# Patient Record
Sex: Male | Born: 1980
Health system: Southern US, Community
[De-identification: ages and names within clinical notes are randomized; demographics above are authoritative.]

## PROBLEM LIST (undated history)

## (undated) DIAGNOSIS — E119 Type 2 diabetes mellitus without complications: Secondary | ICD-10-CM

## (undated) DIAGNOSIS — F419 Anxiety disorder, unspecified: Secondary | ICD-10-CM

## (undated) DIAGNOSIS — J45909 Unspecified asthma, uncomplicated: Secondary | ICD-10-CM

## (undated) HISTORY — DX: Type 2 diabetes mellitus without complications: E11.9

## (undated) HISTORY — PX: NO PAST SURGERIES: SHX2092

## (undated) HISTORY — DX: Anxiety disorder, unspecified: F41.9

---

## 2002-12-27 ENCOUNTER — Emergency Department (HOSPITAL_COMMUNITY): Admission: EM | Admit: 2002-12-27 | Discharge: 2002-12-28 | Payer: Self-pay

## 2002-12-28 ENCOUNTER — Encounter: Payer: Self-pay | Admitting: *Deleted

## 2003-02-20 ENCOUNTER — Encounter: Payer: Self-pay | Admitting: Emergency Medicine

## 2003-02-20 ENCOUNTER — Emergency Department (HOSPITAL_COMMUNITY): Admission: EM | Admit: 2003-02-20 | Discharge: 2003-02-20 | Payer: Self-pay | Admitting: Emergency Medicine

## 2003-02-22 ENCOUNTER — Emergency Department (HOSPITAL_COMMUNITY): Admission: AD | Admit: 2003-02-22 | Discharge: 2003-02-22 | Payer: Self-pay | Admitting: Family Medicine

## 2004-02-25 ENCOUNTER — Emergency Department: Payer: Self-pay | Admitting: Unknown Physician Specialty

## 2004-08-16 ENCOUNTER — Emergency Department: Payer: Self-pay | Admitting: Emergency Medicine

## 2004-09-29 ENCOUNTER — Emergency Department: Payer: Self-pay | Admitting: Emergency Medicine

## 2004-10-16 ENCOUNTER — Emergency Department: Payer: Self-pay | Admitting: Emergency Medicine

## 2005-08-13 ENCOUNTER — Emergency Department: Payer: Self-pay | Admitting: Internal Medicine

## 2007-05-08 ENCOUNTER — Emergency Department: Payer: Self-pay | Admitting: Emergency Medicine

## 2007-05-08 ENCOUNTER — Other Ambulatory Visit: Payer: Self-pay

## 2007-06-29 ENCOUNTER — Emergency Department: Payer: Self-pay | Admitting: Emergency Medicine

## 2007-06-29 ENCOUNTER — Other Ambulatory Visit: Payer: Self-pay

## 2007-07-01 ENCOUNTER — Emergency Department (HOSPITAL_COMMUNITY): Admission: EM | Admit: 2007-07-01 | Discharge: 2007-07-02 | Payer: Self-pay | Admitting: Emergency Medicine

## 2007-07-03 ENCOUNTER — Emergency Department: Payer: Self-pay | Admitting: Emergency Medicine

## 2007-07-07 ENCOUNTER — Emergency Department: Payer: Self-pay | Admitting: Emergency Medicine

## 2007-08-04 ENCOUNTER — Other Ambulatory Visit: Payer: Self-pay

## 2007-08-04 ENCOUNTER — Emergency Department: Payer: Self-pay | Admitting: Emergency Medicine

## 2008-01-21 ENCOUNTER — Emergency Department: Payer: Self-pay | Admitting: Emergency Medicine

## 2008-02-10 ENCOUNTER — Emergency Department (HOSPITAL_COMMUNITY): Admission: EM | Admit: 2008-02-10 | Discharge: 2008-02-10 | Payer: Self-pay | Admitting: Emergency Medicine

## 2008-05-06 ENCOUNTER — Emergency Department (HOSPITAL_COMMUNITY): Admission: EM | Admit: 2008-05-06 | Discharge: 2008-05-06 | Payer: Self-pay | Admitting: Emergency Medicine

## 2009-12-27 IMAGING — CR DG CHEST 1V PORT
1 series · 1 of 1 positions shown · non-contrast
Comparison: none

REASON FOR EXAM: cp
COMMENTS:

PROCEDURE:     DXR - DXR PORTABLE CHEST SINGLE VIEW  - June 29, 2007  [DATE]
RESULT:     The lungs are adequately inflated and clear. The heart and
pulmonary vascularity are within the limits of normal. There is no pleural
effusion.

[view not recorded]
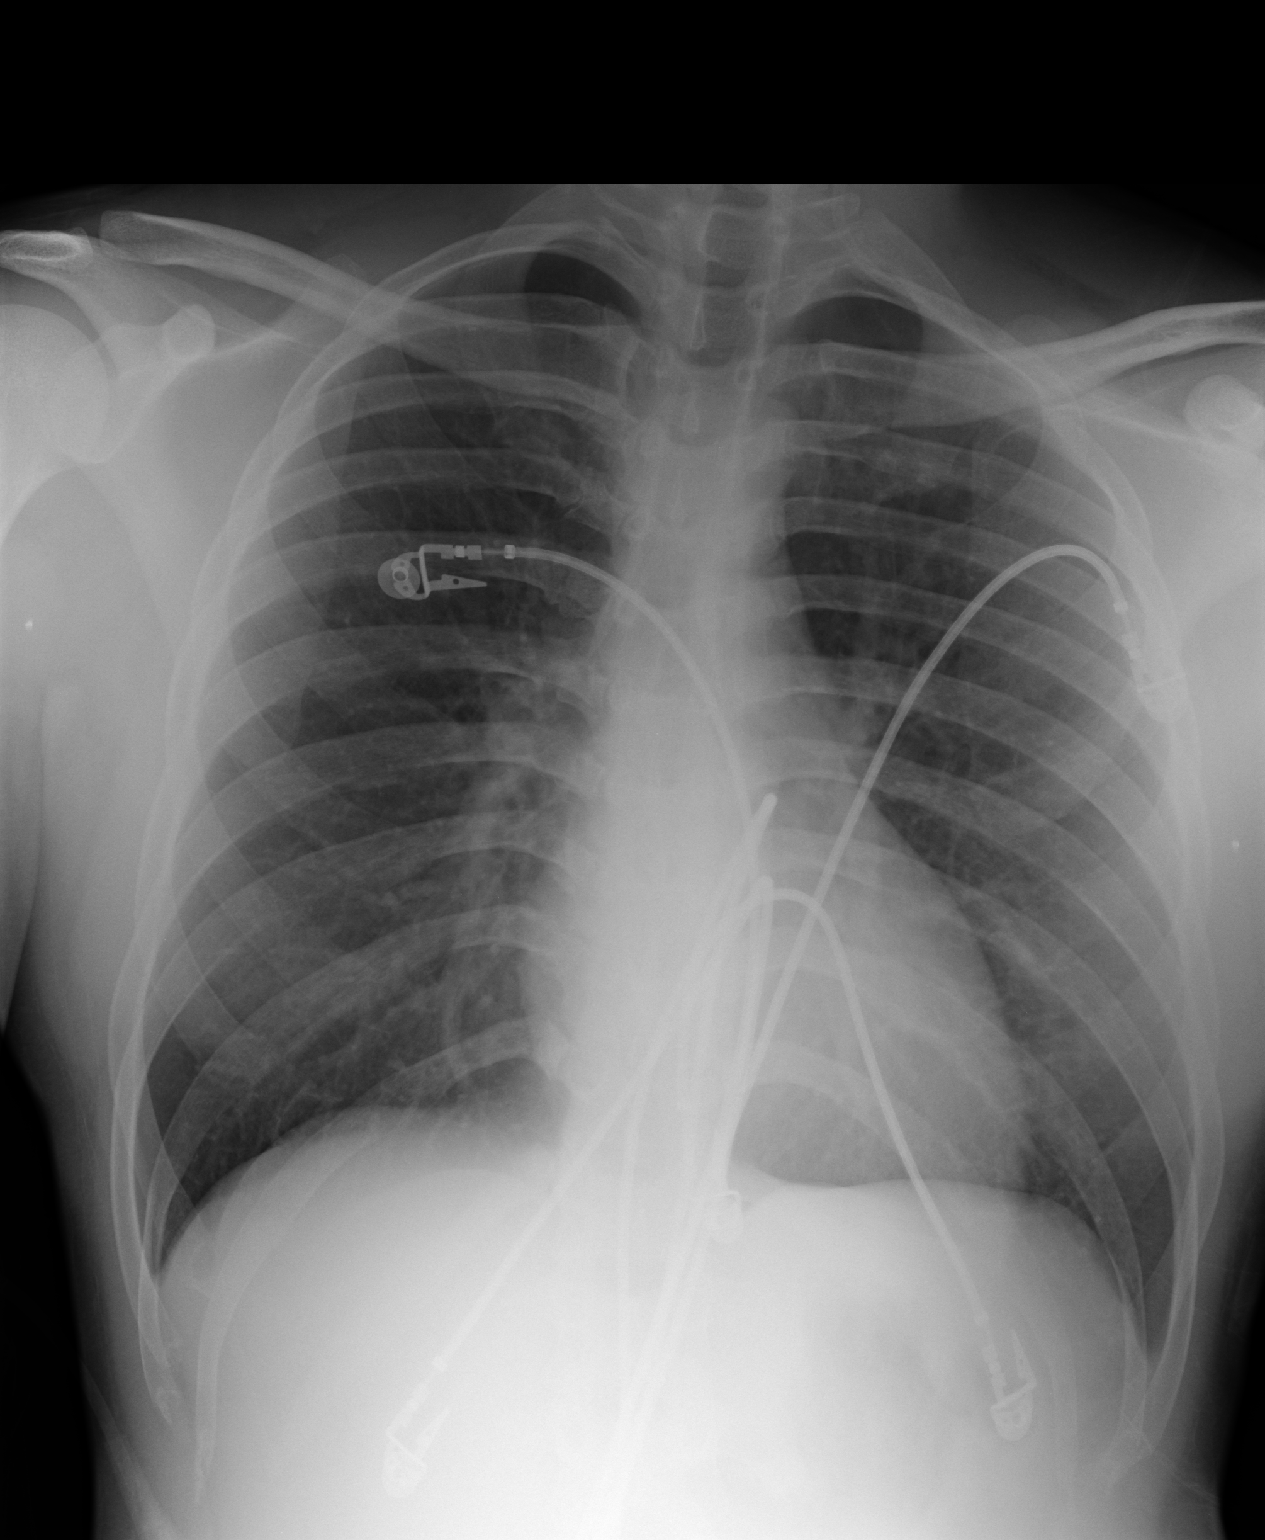

[1 of 1 positions shown; findings below may reference images not displayed]

IMPRESSION: I do not see evidence of acute cardiopulmonary abnormality.
A followup PA and lateral chest x-ray would be of value if the patient has
persistent symptoms.

## 2011-01-26 LAB — POCT I-STAT CREATININE
Creatinine, Ser: 1.2
Operator id: 277751

## 2011-01-26 LAB — I-STAT 8, (EC8 V) (CONVERTED LAB)
Acid-base deficit: 4 — ABNORMAL HIGH
BUN: 6
Bicarbonate: 20.8
Chloride: 110
Glucose, Bld: 82
HCT: 46
Hemoglobin: 15.6
Operator id: 277751
Potassium: 3.5
Sodium: 140
TCO2: 22
pCO2, Ven: 36 — ABNORMAL LOW
pH, Ven: 7.37 — ABNORMAL HIGH

## 2011-01-26 LAB — POCT CARDIAC MARKERS
CKMB, poc: <1 — ABNORMAL LOW
Myoglobin, poc: 91.4
Operator id: 277751

## 2011-01-26 LAB — URINALYSIS, ROUTINE W REFLEX MICROSCOPIC
Glucose, UA: NEGATIVE
Hgb urine dipstick: NEGATIVE
Ketones, ur: 15 — AB
Protein, ur: NEGATIVE
Urobilinogen, UA: 1

## 2011-01-26 LAB — D-DIMER, QUANTITATIVE: D-Dimer, Quant: 0.36

## 2011-05-25 ENCOUNTER — Emergency Department (HOSPITAL_COMMUNITY)
Admission: EM | Admit: 2011-05-25 | Discharge: 2011-05-25 | Disposition: A | Discharge status: Home / Self Care | Payer: Self-pay | Attending: Emergency Medicine | Admitting: Emergency Medicine

## 2011-05-25 DIAGNOSIS — M545 Low back pain, unspecified: Secondary | ICD-10-CM | POA: Insufficient documentation

## 2011-05-25 DIAGNOSIS — M549 Dorsalgia, unspecified: Secondary | ICD-10-CM

## 2011-05-25 DIAGNOSIS — R300 Dysuria: Secondary | ICD-10-CM | POA: Insufficient documentation

## 2011-05-25 DIAGNOSIS — R109 Unspecified abdominal pain: Secondary | ICD-10-CM | POA: Insufficient documentation

## 2011-05-25 LAB — URINALYSIS, ROUTINE W REFLEX MICROSCOPIC
Bilirubin Urine: NEGATIVE
Glucose, UA: NEGATIVE mg/dL
Hgb urine dipstick: NEGATIVE
Ketones, ur: NEGATIVE mg/dL
Nitrite: NEGATIVE
Protein, ur: NEGATIVE mg/dL
Specific Gravity, Urine: 1.019 (ref 1.005–1.030)
Urobilinogen, UA: 1 mg/dL (ref 0.0–1.0)
pH: 5.5 (ref 5.0–8.0)

## 2011-05-25 LAB — URINE MICROSCOPIC-ADD ON

## 2011-05-25 NOTE — ED Notes (Signed)
Pt reports intermittant bilateral flank pain x 2 weeks, lower abd pain near bladder and difficulty/pain with urination x 2 weeks. Denies blood in urine. Denies hx of kidney stones. Denies flank pain right now. Pt able to give urine specimen.

## 2011-05-25 NOTE — ED Provider Notes (Signed)
History    30yM with dysuria and lower back pressure. Gradual onset of symptoms about 2w ago. Only has symptoms when tries to urinate. Back pain feels like pressure and does not seem to lateralize. No fever or chills. Denies testicular pain, swelling, discharge or concern for std. No abdominal pain. No n/v. Smoker, otherwise health. Takes tylenol pm PRN sleep otherwise no meds.   CSN: [id number]  Arrival date & time 05/25/11  1117   First MD Initiated Contact with Patient 05/25/11 1136      Chief Complaint  Patient presents with  . Flank Pain    (Consider location/radiation/quality/duration/timing/severity/associated sxs/prior treatment) HPI  No past medical history on file.  No past surgical history on file.  No family history on file.  History  Substance Use Topics  . Smoking status: Not on file  . Smokeless tobacco: Not on file  . Alcohol Use: Not on file      Review of Systems   Review of symptoms negative unless otherwise noted in HPI.  Allergies  Review of patient's allergies indicates no known allergies.  Home Medications   Current Outpatient Rx  Name Route Sig Dispense Refill  . IBUPROFEN-DIPHENHYDRAMINE HCL 200-25 MG PO CAPS Oral Take 1 tablet by mouth daily as needed. For pain      BP 140/99  Pulse 114  Temp(Src) 98 F (36.7 C) (Oral)  Resp 18  Ht 5\' 7"  (1.702 m)  Wt 190 lb (86.183 kg)  BMI 29.76 kg/m2  SpO2 98%  Physical Exam  Nursing note and vitals reviewed. Constitutional: He appears well-developed and well-nourished. No distress.       Sitting up in bed. Pleasant and polite. NAD.  HENT:  Head: Normocephalic and atraumatic.  Right Ear: External ear normal.  Left Ear: External ear normal.  Eyes: Conjunctivae are normal. Pupils are equal, round, and reactive to light. Right eye exhibits no discharge. Left eye exhibits no discharge.  Neck: Neck supple.  Cardiovascular: Normal rate, regular rhythm and normal heart sounds.  Exam reveals no  gallop and no friction rub.   No murmur heard.      No tachycardia noted on exam as in triage vitals  Pulmonary/Chest: Effort normal and breath sounds normal. No respiratory distress.  Abdominal: Soft. He exhibits no distension. There is no tenderness. There is no rebound and no guarding.  Genitourinary:       No cva tenderness  Musculoskeletal: He exhibits no edema and no tenderness.  Neurological: He is alert.  Skin: Skin is warm and dry. He is not diaphoretic.  Psychiatric: He has a normal mood and affect. His behavior is normal. Thought content normal.    ED Course  Procedures (including critical care time)  Smoking Cessation Counseling (3-10 minutes): Pt counseled extensively concerning the health risks of smoking and the benefits of quitting. Reviewed strategies and techniques to help quit. Pharmacological cessation aids offered but pt is declining at this time.  Labs Reviewed  URINALYSIS, ROUTINE W REFLEX MICROSCOPIC - Abnormal; Notable for the following:    Leukocytes, UA SMALL (*)    All other components within normal limits  URINE MICROSCOPIC-ADD ON  LAB REPORT - SCANNED   No results found.   1. Back pain       MDM  30yM with flank pain. Consider uti/pyelonephrtis, ureteral stone, musculoskeletal, appy. Colits, STD. Etiology unclear but low clinical suspicion for urgent/emergent process. UA not suggestive of infection. Abdominal examination is benign. Plan symptomatic treatment and outpatient followup as  needed. Strict return precuations discussed.       Raeford Razor, MD 05/30/11 [phone]

## 2011-05-25 NOTE — ED Notes (Signed)
Took an NSAID to help with rt. Lower flank pain, and help some.

## 2011-05-25 NOTE — ED Notes (Signed)
Difficulty voiding for x 2 weeks. Tried to stay hydrated. Lower flank pain - intermittent. No std symptoms. No fever. Urine clear.

## 2012-03-10 ENCOUNTER — Emergency Department (HOSPITAL_COMMUNITY): Payer: Self-pay

## 2012-03-10 ENCOUNTER — Emergency Department (HOSPITAL_COMMUNITY)
Admission: EM | Admit: 2012-03-10 | Discharge: 2012-03-10 | Disposition: A | Discharge status: Home / Self Care | Payer: Self-pay | Attending: Emergency Medicine | Admitting: Emergency Medicine

## 2012-03-10 ENCOUNTER — Encounter (HOSPITAL_COMMUNITY): Payer: Self-pay | Admitting: Emergency Medicine

## 2012-03-10 DIAGNOSIS — M549 Dorsalgia, unspecified: Secondary | ICD-10-CM

## 2012-03-10 DIAGNOSIS — F17200 Nicotine dependence, unspecified, uncomplicated: Secondary | ICD-10-CM | POA: Insufficient documentation

## 2012-03-10 DIAGNOSIS — M545 Low back pain, unspecified: Secondary | ICD-10-CM | POA: Insufficient documentation

## 2012-03-10 DIAGNOSIS — Z79899 Other long term (current) drug therapy: Secondary | ICD-10-CM | POA: Insufficient documentation

## 2012-03-10 DIAGNOSIS — J45909 Unspecified asthma, uncomplicated: Secondary | ICD-10-CM | POA: Insufficient documentation

## 2012-03-10 HISTORY — DX: Unspecified asthma, uncomplicated: J45.909

## 2012-03-10 MED ORDER — CYCLOBENZAPRINE HCL 10 MG PO TABS: 10.0000 mg | ORAL_TABLET | Freq: Three times a day (TID) | ORAL | Status: DC | PRN

## 2012-03-10 MED ORDER — IBUPROFEN 800 MG PO TABS: 800.0000 mg | ORAL_TABLET | Freq: Three times a day (TID) | ORAL | Status: DC | PRN

## 2012-03-10 NOTE — ED Notes (Signed)
Pt c/o lower back pain x 2 weeks. Pt states back feels stiff and "jerking" when sitting and with movement. Denies gi/gu.

## 2012-03-10 NOTE — ED Provider Notes (Signed)
History   This chart was scribed for Benny Lennert, MD by Charolett Bumpers . The patient was seen in room APA17/APA17. Patient's care was started at 0816.   CSN: [id number]  Arrival date & time 03/10/12  4098   First MD Initiated Contact with Patient 03/10/12 [phone]      Chief Complaint  Patient presents with  . Back Pain    HPI Comments: Matthew Mcfarland is a 31 y.o. male who presents to the Emergency Department complaining of moderate, gradually worsening lower back pain for the past 2 weeks. He state he was loading heavy objects prior to the onset of back pain but denies any known injuries. He reports he has tried hot/cold compresses and ibuprofen without relief. He states when sits down he feels pressure and his back is worse. He denies any urinary symptoms.   Patient is a 31 y.o. male presenting with back pain. The history is provided by the patient. No language interpreter was used.  Back Pain  This is a new problem. The current episode started more than 1 week ago. The problem has been gradually worsening. The pain is associated with lifting heavy objects. The pain is present in the lumbar spine. Pertinent negatives include no chest pain, no headaches and no abdominal pain.    Past Medical History  Diagnosis Date  . Asthma     History reviewed. No pertinent past surgical history.  History reviewed. No pertinent family history.  History  Substance Use Topics  . Smoking status: Current Every Day Smoker -- 0.5 packs/day    Types: Cigarettes  . Smokeless tobacco: Not on file  . Alcohol Use: No      Review of Systems  Constitutional: Negative for fatigue.  HENT: Negative for congestion, sinus pressure and ear discharge.   Eyes: Negative for discharge.  Respiratory: Negative for cough.   Cardiovascular: Negative for chest pain.  Gastrointestinal: Negative for abdominal pain and diarrhea.  Genitourinary: Negative for frequency and hematuria.  Musculoskeletal:  Positive for back pain.  Skin: Negative for rash.  Neurological: Negative for seizures and headaches.  Hematological: Negative.   Psychiatric/Behavioral: Negative for hallucinations.    Allergies  Review of patient's allergies indicates no known allergies.  Home Medications   Current Outpatient Rx  Name  Route  Sig  Dispense  Refill  . IBUPROFEN-DIPHENHYDRAMINE HCL 200-25 MG PO CAPS   Oral   Take 1 tablet by mouth daily as needed. For pain           BP 134/84  Pulse 92  Temp 98.2 F (36.8 C)  Resp 18  Ht 5\' 6"  (1.676 m)  Wt 190 lb (86.183 kg)  BMI 30.67 kg/m2  SpO2 100%  Physical Exam  Constitutional: He is oriented to person, place, and time. He appears well-developed.  HENT:  Head: Normocephalic and atraumatic.  Eyes: Conjunctivae normal and EOM are normal. No scleral icterus.  Neck: Normal range of motion. Neck supple. No thyromegaly present.  Cardiovascular: Normal rate, regular rhythm and normal heart sounds.  Exam reveals no gallop and no friction rub.   No murmur heard. Pulmonary/Chest: Effort normal and breath sounds normal. No stridor. No respiratory distress. He has no wheezes. He has no rales. He exhibits no tenderness.  Abdominal: Soft. He exhibits no distension. There is no tenderness. There is no rebound.  Musculoskeletal: Normal range of motion. He exhibits tenderness. He exhibits no edema.       Tenderness to midline lumbar spine. Cervical  and thoracic spine are non-tender.   Lymphadenopathy:    He has no cervical adenopathy.  Neurological: He is alert and oriented to person, place, and time. Coordination normal.  Skin: Skin is warm. No rash noted. No erythema.  Psychiatric: He has a normal mood and affect. His behavior is normal.    ED Course  Procedures (including critical care time)  DIAGNOSTIC STUDIES: Oxygen Saturation is 100% on room air, normal by my interpretation.    COORDINATION OF CARE:  08:21-Discussed planned course of treatment  with the patient including an x-ray of lumbar spine, who is agreeable at this time.   10:08-Recheck: Informed pt of negative x-ray results. Will d/c home with a muscle relaxer and pain medication and pt is to f/u with PCP. He is agreeable with plan.   Dg Lumbar Spine Complete  03/10/2012  *RADIOLOGY REPORT*  Clinical Data: Back pain  LUMBAR SPINE - COMPLETE 4+ VIEW  Comparison: None  Findings: The imaged vertebral bodies and inter-vertebral disc spaces are maintained. No displaced acute fracture or dislocation identified.   The para-vertebral and overlying soft tissues are within normal limits.  IMPRESSION: No acute osseous abnormality of the lumbar spine.   Original Report Authenticated By: Jearld Lesch, M.D.      No diagnosis found.    MDM      The chart was scribed for me under my direct supervision.  I personally performed the history, physical, and medical decision making and all procedures in the evaluation of this patient.Benny Lennert, MD 03/10/12 [phone]

## 2013-09-02 ENCOUNTER — Emergency Department (HOSPITAL_COMMUNITY)
Admission: EM | Admit: 2013-09-02 | Discharge: 2013-09-02 | Disposition: A | Discharge status: Home / Self Care | Payer: Self-pay | Attending: Emergency Medicine | Admitting: Emergency Medicine

## 2013-09-02 ENCOUNTER — Encounter (HOSPITAL_COMMUNITY): Payer: Self-pay | Admitting: Emergency Medicine

## 2013-09-02 DIAGNOSIS — S0100XA Unspecified open wound of scalp, initial encounter: Secondary | ICD-10-CM | POA: Insufficient documentation

## 2013-09-02 DIAGNOSIS — J45909 Unspecified asthma, uncomplicated: Secondary | ICD-10-CM | POA: Insufficient documentation

## 2013-09-02 DIAGNOSIS — Y929 Unspecified place or not applicable: Secondary | ICD-10-CM | POA: Insufficient documentation

## 2013-09-02 DIAGNOSIS — R110 Nausea: Secondary | ICD-10-CM | POA: Insufficient documentation

## 2013-09-02 DIAGNOSIS — F17200 Nicotine dependence, unspecified, uncomplicated: Secondary | ICD-10-CM | POA: Insufficient documentation

## 2013-09-02 DIAGNOSIS — S0101XA Laceration without foreign body of scalp, initial encounter: Secondary | ICD-10-CM

## 2013-09-02 DIAGNOSIS — Z79899 Other long term (current) drug therapy: Secondary | ICD-10-CM | POA: Insufficient documentation

## 2013-09-02 DIAGNOSIS — S0990XA Unspecified injury of head, initial encounter: Secondary | ICD-10-CM | POA: Insufficient documentation

## 2013-09-02 DIAGNOSIS — R42 Dizziness and giddiness: Secondary | ICD-10-CM | POA: Insufficient documentation

## 2013-09-02 DIAGNOSIS — Y9389 Activity, other specified: Secondary | ICD-10-CM | POA: Insufficient documentation

## 2013-09-02 DIAGNOSIS — Y990 Civilian activity done for income or pay: Secondary | ICD-10-CM | POA: Insufficient documentation

## 2013-09-02 DIAGNOSIS — W208XXA Other cause of strike by thrown, projected or falling object, initial encounter: Secondary | ICD-10-CM | POA: Insufficient documentation

## 2013-09-02 DIAGNOSIS — Z23 Encounter for immunization: Secondary | ICD-10-CM | POA: Insufficient documentation

## 2013-09-02 DIAGNOSIS — H9190 Unspecified hearing loss, unspecified ear: Secondary | ICD-10-CM | POA: Insufficient documentation

## 2013-09-02 MED ORDER — TETANUS-DIPHTH-ACELL PERTUSSIS 5-2.5-18.5 LF-MCG/0.5 IM SUSP
0.5000 mL | Freq: Once | INTRAMUSCULAR | Status: AC
  Administered 2013-09-02: 0.5 mL via INTRAMUSCULAR
  Filled 2013-09-02: qty 0.5

## 2013-09-02 MED ORDER — ACETAMINOPHEN 500 MG PO TABS
1000.0000 mg | ORAL_TABLET | Freq: Once | ORAL | Status: AC
  Administered 2013-09-02: 1000 mg via ORAL
  Filled 2013-09-02: qty 2

## 2013-09-02 NOTE — ED Notes (Signed)
Pt states he has slight nausea off and on. Feels "a little woozy at times". Pt alert, no acute distress.

## 2013-09-02 NOTE — ED Provider Notes (Signed)
CSN: [id number]     Arrival date & time 09/02/13  1912 History  This chart was scribed for non-physician practitioner, Otilio Miuatherine E. Graeme Menees, PA-C, working with Celene KrasJon R Knapp, MD, by Ellin MayhewMichael Levi, ED Scribe. This patient was seen in room WTR6/WTR6 and the patient's care was started at 8:42 PM.  The history is provided by the patient. No language interpreter was used.   HPI Comments: Matthew Mcfarland is a 33 y.o. male who presents to the Emergency Department with a chief complaint of a head injury to the L occipital region with onset approximately 2 hours ago today. Patient reports that he opened the refrigerator earlier at work and a Passenger transport managergarbage disposal motor (approximately 30-50lbs) fell on top of his head from a distance of 2-3 feet. He characterizes the pain as a pressure sensation that is made worse with contact pressure. He reports some associated dizziness, nausea, and hearing disturbance of the L ear, which he describes as a "muffline". Patient denies any LOC, vision changes, or neck pain. Patient reports he works with a [employer] company.  Past Medical History  Diagnosis Date  . Asthma    History reviewed. No pertinent past surgical history. Family History  Problem Relation Age of Onset  . Diabetes Father   . Seizures Father    History  Substance Use Topics  . Smoking status: Current Every Day Smoker -- 0.50 packs/day    Types: Cigarettes  . Smokeless tobacco: Not on file  . Alcohol Use: No    Review of Systems  Constitutional: Negative for fever and chills.  HENT:       Hearing disturbance  Eyes: Negative for visual disturbance.  Gastrointestinal: Positive for nausea. Negative for vomiting.  Musculoskeletal: Negative for arthralgias, neck pain and neck stiffness.  Skin: Positive for wound.  Neurological: Positive for dizziness and headaches. Negative for syncope, weakness, light-headedness and numbness.  All other systems reviewed and are negative.  Allergies  Review of  patient's allergies indicates no known allergies.  Home Medications   Prior to Admission medications   Medication Sig Start Date End Date Taking? Authorizing Provider  cyclobenzaprine (FLEXERIL) 10 MG tablet Take 1 tablet (10 mg total) by mouth 3 (three) times daily as needed for muscle spasms. 03/10/12   Benny LennertJoseph L Zammit, MD  ibuprofen (ADVIL,MOTRIN) 200 MG tablet Take 800 mg by mouth every 6 (six) hours as needed. Back pain.    Historical Provider, MD  ibuprofen (ADVIL,MOTRIN) 800 MG tablet Take 1 tablet (800 mg total) by mouth every 8 (eight) hours as needed for pain (pain). 03/10/12   Benny LennertJoseph L Zammit, MD  Ibuprofen-Diphenhydramine HCl (ADVIL PM) 200-25 MG CAPS Take 1 tablet by mouth daily as needed. For pain    Historical Provider, MD  oxymetazoline (AFRIN) 0.05 % nasal spray Place 2 sprays into the nose 2 (two) times daily.    Historical Provider, MD   Triage Vitals: BP 129/94  Pulse 101  Temp(Src) 98 F (36.7 C) (Oral)  Resp 16  SpO2 99%  Physical Exam  Nursing note and vitals reviewed. Constitutional: He is oriented to person, place, and time. He appears well-developed and well-nourished. No distress.  HENT:  Head: Normocephalic and atraumatic.  Eyes:  Normal appearance  Neck: Normal range of motion.  Cardiovascular: Normal rate, regular rhythm and normal heart sounds.   Pulmonary/Chest: Effort normal and breath sounds normal. He has no wheezes.  Musculoskeletal: Normal range of motion.  Patient is ambulatory with no pain or abnormalities.  Neurological: He is alert and oriented to person, place, and time. No sensory deficit.  CN 3-12 intact.  No sensory deficits.  5/5 and equal upper and lower extremity strength.  No past pointing.  nml gait.   Psychiatric: He has a normal mood and affect. His behavior is normal.    ED Course  Procedures (including critical care time) LACERATION REPAIR Performed by: Otilio Miuatherine E Kiosha Buchan Authorized by: Otilio Miuatherine E Miyuki Rzasa Consent:  Verbal consent obtained. Risks and benefits: risks, benefits and alternatives were discussed Consent given by: patient Patient identity confirmed: provided demographic data Prepped and Draped in normal sterile fashion Wound explored  Laceration Location: R parietal scalp  Laceration Length: 2.0cm  No Foreign Bodies seen or palpated  Anesthesia: none  Irrigation method: lavage (nursing staff) Amount of cleaning: standard  Skin closure: 2 staples    Patient tolerance: Patient tolerated the procedure well with no immediate complications.  COORDINATION OF CARE: 8:47 PM-Discussed my low suspicion of serious brain injury. Will apply a few staples to close head wound. Will update tetanus shot in ED. Advised patient to return to ED if symptoms worsen or it any severe dizziness or behavioral abnormalities develops. Treatment plan discussed with patient and patient agrees.  MDM   Final diagnoses:  Minor head injury  Scalp laceration    Healthy 33yo M presents w/ head injury and scalp lac.  Wound cleaned by nursing staff and then stapled.  Tetanus updated.  Doubt TBI; pt is not anti-coagulated, no LOC, no neurologic complaints other than mild headache and no focal neuro deficits on exam.  Tylenol ordered for pain.   Return precautions discussed.    I personally performed the services described in this documentation, which was scribed in my presence. The recorded information has been reviewed and is accurate.    Otilio Miuatherine E Schylar Wuebker, PA-C 09/02/13 2209

## 2013-09-02 NOTE — ED Notes (Signed)
Pt states someone put a garbage disposal motor on top of the refrigerator and when he opened it it fell off and struck him on the head  Pt has a small lac on the top of his head  Pt denies LOC but states his head hurts  Pt states when you touch the laceration it feels like pressure  Pt states this happened at work

## 2013-09-02 NOTE — Discharge Instructions (Signed)
Treat pain w/ motrin or tylenol.  You can alternate these two medications every three hours if necessary.  Apply an ice pack to your scalp to reduce swelling of scalp.  [facility] your primary care doctor [facility] [phone]; [address]) in 7-10 days for wound recheck and staple removal.  You should be seen sooner if you develop fever, worsening pain or redness/drainage of pus at site of wound.  You should also return if you develop worsening headache, vision changes, severe dizziness, numbness of an extremity, change in speech, difficulty walking or with balance or more than 2 episodes of vomiting.

## 2013-09-04 NOTE — ED Provider Notes (Signed)
Medical screening examination/treatment/procedure(s) were performed by non-physician practitioner and as supervising physician I was immediately available for consultation/collaboration.    Navreet Bolda R Darah Simkin, MD 09/04/13 1536 

## 2013-09-11 ENCOUNTER — Encounter (HOSPITAL_COMMUNITY): Payer: Self-pay | Admitting: Emergency Medicine

## 2013-09-11 ENCOUNTER — Emergency Department (INDEPENDENT_AMBULATORY_CARE_PROVIDER_SITE_OTHER)
Admission: EM | Admit: 2013-09-11 | Discharge: 2013-09-11 | Disposition: A | Discharge status: Home / Self Care | Payer: Self-pay | Source: Home / Self Care | Attending: Emergency Medicine | Admitting: Emergency Medicine

## 2013-09-11 DIAGNOSIS — Z4802 Encounter for removal of sutures: Secondary | ICD-10-CM

## 2013-09-11 NOTE — ED Notes (Signed)
Pt is here to have staples removed; placed in [facility] no new concerns Alert w/no signs of acute distress.

## 2013-09-11 NOTE — ED Provider Notes (Signed)
CSN: [id number]     Arrival date & time 09/11/13  1612 History   First MD Initiated Contact with Patient 09/11/13 1717     Chief Complaint  Patient presents with  . Suture / Staple Removal   (Consider location/radiation/quality/duration/timing/severity/associated sxs/prior Treatment) HPI Comments: Patient presents for staple removal. Wound healing appropriately. No current complaints. No increasing pain or drainage.  Patient is a 33 y.o. male presenting with suture removal.  Suture / Staple Removal    Past Medical History  Diagnosis Date  . Asthma    History reviewed. No pertinent past surgical history. Family History  Problem Relation Age of Onset  . Diabetes Father   . Seizures Father    History  Substance Use Topics  . Smoking status: Current Every Day Smoker -- 0.50 packs/day    Types: Cigarettes  . Smokeless tobacco: Not on file  . Alcohol Use: No    Review of Systems  Skin: Positive for wound.  All other systems reviewed and are negative.   Allergies  Review of patient's allergies indicates no known allergies.  Home Medications   Prior to Admission medications   Medication Sig Start Date End Date Taking? Authorizing Provider  cyclobenzaprine (FLEXERIL) 10 MG tablet Take 1 tablet (10 mg total) by mouth 3 (three) times daily as needed for muscle spasms. 03/10/12   Benny LennertJoseph L Zammit, MD  ibuprofen (ADVIL,MOTRIN) 200 MG tablet Take 800 mg by mouth every 6 (six) hours as needed. Back pain.    Historical Provider, MD  ibuprofen (ADVIL,MOTRIN) 800 MG tablet Take 1 tablet (800 mg total) by mouth every 8 (eight) hours as needed for pain (pain). 03/10/12   Benny LennertJoseph L Zammit, MD  Ibuprofen-Diphenhydramine HCl (ADVIL PM) 200-25 MG CAPS Take 1 tablet by mouth daily as needed. For pain    Historical Provider, MD  oxymetazoline (AFRIN) 0.05 % nasal spray Place 2 sprays into the nose 2 (two) times daily.    Historical Provider, MD   There were no vitals taken for this  visit. Physical Exam  Nursing note and vitals reviewed. Constitutional: He is oriented to person, place, and time. He appears well-developed and well-nourished. No distress.  HENT:  Head: Normocephalic. Head is with laceration.    Pulmonary/Chest: Effort normal. No respiratory distress.  Neurological: He is alert and oriented to person, place, and time. Coordination normal.  Skin: Skin is warm and dry. No rash noted. He is not diaphoretic.  Psychiatric: He has a normal mood and affect. Judgment normal.    ED Course  Procedures (including critical care time) Labs Review Labs Reviewed - No data to display  Imaging Review No results found.   MDM   1. Encounter for staple removal    Wound appears healed appropriately. Staples removed. Followup as needed.    Graylon GoodZachary H Novia Lansberry, PA-C 09/11/13 1723

## 2013-09-11 NOTE — Discharge Instructions (Signed)
  Staple Removal Care After The staples used to close your skin have been removed. The wound needs continued care so it can heal completely and without problems. The care described here will need to be done for another 5-10 days unless your caregiver advises otherwise.  HOME CARE INSTRUCTIONS   Keep wound site dry and clean.  If skin adhesive strips were applied after the staples were removed, they will begin to peel off in a few days. If they remain after fourteen days, they may be peeled off and discarded.  If you still have a dressing, change it at least once a day or as instructed by your caregiver. If the bandage sticks, soak it off with warm water. Pat dry with a clean towel. Look for signs of infection (see below).  Reapply cream or ointment according to your caregiver's instruction. This will help prevent infection and keep the bandage from sticking. Use of a non-stick material over the wound and under the dressing or wrap will also help keep the bandage from sticking.  If the bandage becomes wet, dirty or develops a foul smell, change it as soon as possible.  New scars become sunburned easily. Use sunscreens with protection factor (SPF) of at least 15 when out in the sun.  Only take over-the-counter or prescription medicines for pain, discomfort or fever as directed by your caregiver. SEEK IMMEDIATE MEDICAL CARE IF:   There is redness, swelling or increasing pain in the wound.  Pus is coming from the wound.  An unexplained oral temperature above 102 F (38.9 C) develops.  You notice a foul smell coming from the wound or dressing.  There is a breaking open of the suture line (edges not staying together) of the wound edges after staples have been removed. Document Released: 04/05/2008 Document Revised: 07/16/2011 Document Reviewed: 04/05/2008 [employer] Patient Information 2014 [employer].  

## 2013-09-12 NOTE — ED Provider Notes (Signed)
Medical screening examination/treatment/procedure(s) were performed by non-physician practitioner and as supervising physician I was immediately available for consultation/collaboration.  Aasiyah Auerbach, M.D.  Herta Hink C Zeynab Klett, MD 09/12/13 1856 

## 2014-02-15 ENCOUNTER — Encounter (HOSPITAL_COMMUNITY): Payer: Self-pay | Admitting: Emergency Medicine

## 2014-02-15 ENCOUNTER — Emergency Department (HOSPITAL_COMMUNITY)
Admission: EM | Admit: 2014-02-15 | Discharge: 2014-02-15 | Disposition: A | Discharge status: Home / Self Care | Payer: Self-pay | Attending: Emergency Medicine | Admitting: Emergency Medicine

## 2014-02-15 DIAGNOSIS — M5431 Sciatica, right side: Secondary | ICD-10-CM

## 2014-02-15 DIAGNOSIS — Z720 Tobacco use: Secondary | ICD-10-CM | POA: Insufficient documentation

## 2014-02-15 DIAGNOSIS — G5701 Lesion of sciatic nerve, right lower limb: Secondary | ICD-10-CM | POA: Insufficient documentation

## 2014-02-15 DIAGNOSIS — J45909 Unspecified asthma, uncomplicated: Secondary | ICD-10-CM | POA: Insufficient documentation

## 2014-02-15 DIAGNOSIS — Z79899 Other long term (current) drug therapy: Secondary | ICD-10-CM | POA: Insufficient documentation

## 2014-02-15 MED ORDER — HYDROCODONE-ACETAMINOPHEN 5-325 MG PO TABS: 1.0000 | ORAL_TABLET | Freq: Four times a day (QID) | ORAL | Status: DC | PRN

## 2014-02-15 MED ORDER — NAPROXEN 500 MG PO TABS: 500.0000 mg | ORAL_TABLET | Freq: Two times a day (BID) | ORAL | Status: DC | PRN

## 2014-02-15 MED ORDER — CYCLOBENZAPRINE HCL 10 MG PO TABS: 10.0000 mg | ORAL_TABLET | Freq: Three times a day (TID) | ORAL | Status: DC | PRN

## 2014-02-15 MED ORDER — PREDNISONE 20 MG PO TABS: ORAL_TABLET | ORAL | Status: DC

## 2014-02-15 NOTE — Discharge Instructions (Signed)
Your pain seems to be related to piriformis syndrome, which is causing some nerve pain down the back of your leg from pinching of the sciatic nerve. Use massage, heat, and naprosyn to help with this pain. Use prednisone as directed to help with the nerve pain. Use norco as needed, as directed for breakthrough pain. Use flexeril as needed as directed for spasms. Use the resource guide below to find a regular doctor. Avoid strenuous activities for 2 weeks to help resolve your pain. Return to the ER for changes or worsening of pain.   Piriformis Syndrome with Rehab Piriformis syndrome is a condition the affects the nervous system in the area of the hip, and is characterized by pain and possibly a loss of feeling in the backside (posterior) thigh that may extend down the entire length of the leg. The symptoms are caused by an increase in pressure on the sciatic nerve by the piriformis muscle, which is on the back of the hip and is responsible for externally rotating the hip. The sciatic nerve and its branches connect to much of the leg. Normally the sciatic nerve runs between the piriformis muscle and other muscles. However, in certain individuals the nerve runs through the muscle, which causes an increase in pressure on the nerve and results in the symptoms of piriformis syndrome. SYMPTOMS   Pain, tingling, numbness, or burning in the back of the thigh that may also extend down the entire leg.  Occasionally, tenderness in the buttock.  Loss of function of the leg.  Pain that worsens when using the piriformis muscle (running, jumping, or stairs).  Pain that increases with prolonged sitting.  Pain that is lessened by lying flat on the back. CAUSES   Piriformis syndrome is the result of an increase in pressure placed on the sciatic nerve. Oftentimes, piriformis syndrome is an overuse injury.  Stress placed on the nerve from a sudden increase in the intensity, frequency, or duration of  training.  Compensation of other extremity injuries. RISK INCREASES WITH:  Sports that involve the piriformis muscle (running, walking, or jumping).  You are born with (congenital) a defect in which the sciatic nerve passes through the muscle. PREVENTION  Warm up and stretch properly before activity.  Allow for adequate recovery between workouts.  Maintain physical fitness:  Strength, flexibility, and endurance.  Cardiovascular fitness. PROGNOSIS  If treated properly, the symptoms of piriformis syndrome usually resolve in 2 to 6 weeks. RELATED COMPLICATIONS   Persistent and possibly permanent pain and numbness in the lower extremity.  Weakness of the extremity that may progress to disability and inability to compete. TREATMENT  The most effective treatment for piriformis syndrome is rest from any activities that aggravate the symptoms. Ice and pain medication may help reduce pain and inflammation. The use of strengthening and stretching exercises may help reduce pain with activity. These exercises may be performed at home or with a therapist. A referral to a therapist may be given for further evaluation and treatment, such as ultrasound. Corticosteroid injections may be given to reduce inflammation that is causing pressure to be placed on the sciatic nerve. If nonsurgical (conservative) treatment is unsuccessful, then surgery may be recommended.  MEDICATION   If pain medication is necessary, then nonsteroidal anti-inflammatory medications, such as aspirin and ibuprofen, or other minor pain relievers, such as acetaminophen, are often recommended.  Do not take pain medication for 7 days before surgery.  Prescription pain relievers may be given if deemed necessary by your caregiver. Use  only as directed and only as much as you need.  Corticosteroid injections may be given by your caregiver. These injections should be reserved for the most serious cases, because they may only be given  a certain number of times. HEAT AND COLD:   Cold treatment (icing) relieves pain and reduces inflammation. Cold treatment should be applied for 10 to 15 minutes every 2 to 3 hours for inflammation and pain and immediately after any activity that aggravates your symptoms. Use ice packs or massage the area with a piece of ice (ice massage).  Heat treatment may be used prior to performing the stretching and strengthening activities prescribed by your caregiver, physical therapist, or athletic trainer. Use a heat pack or soak the injury in warm water. SEEK IMMEDIATE MEDICAL CARE IF:  Treatment seems to offer no benefit, or the condition worsens.  Any medications produce adverse side effects. EXERCISES RANGE OF MOTION (ROM) AND STRETCHING EXERCISES - Piriformis Syndrome These exercises may help you when beginning to rehabilitate your injury. Your symptoms may resolve with or without further involvement from your physician, physical therapist, or athletic trainer. While completing these exercises, remember:   Restoring tissue flexibility helps normal motion to return to the joints. This allows healthier, less painful movement and activity.  An effective stretch should be held for at least 30 seconds.  A stretch should never be painful. You should only feel a gentle lengthening or release in the stretched tissue. STRETCH - Hip Rotators  Lie on your back on a firm surface. Grasp your right / left knee with your right / left hand and your ankle with your opposite hand.  Keeping your hips and shoulders firmly planted, gently pull your right / left knee and rotate your lower leg toward your opposite shoulder until you feel a stretch in your buttocks.  Hold this stretch for __________ seconds. Repeat this stretch __________ times. Complete this stretch __________ times per day. STRETCH - Iliotibial Band  On the floor or bed, lie on your side so your right / left leg is on top. Bend your knee and  grab your ankle.  Slowly bring your knee back so that your thigh is in line with your trunk. Keep your heel at your buttocks and gently arch your back so your head, shoulders, and hips line up.  Slowly lower your leg so that your knee approaches the floor/bed until you feel a gentle stretch on the outside of your right / left thigh. If you do not feel a stretch and your knee will not fall farther, place the heel of your opposite foot on top of your knee and pull your thigh down farther.  Hold this stretch for __________ seconds. Repeat __________ times. Complete __________ times per day. STRENGTHENING EXERCISES - Piriformis Syndrome  These are some of the caregiver again or until your symptoms are resolved. Remember:   Strong muscles with good endurance tolerate stress better.  Do the exercises as initially prescribed by your caregiver. Progress slowly with each exercise, gradually increasing the number of repetitions and weight used under their guidance. STRENGTH - Hip Abductors, Straight Leg Raises Be aware of your form throughout the entire exercise so that you exercise the correct muscles. Sloppy form means that you are not strengthening the correct muscles.  Lie on your side so that your head, shoulders, knee, and hip line up. You may bend your lower knee to help maintain your balance. Your right / left leg should be on top.  Roll your hips slightly forward, so that your hips are stacked directly over each other and your right / left knee is facing forward.  Lift your top leg up 4-6 inches, leading with your heel. Be sure that your foot does not drift forward or that your knee does not roll toward the ceiling.  Hold this position for __________ seconds. You should feel the muscles in your outer hip lifting (you may not notice this until your leg begins to tire).  Slowly lower your leg to the starting position. Allow the muscles to fully relax before beginning the next  repetition. Repeat __________ times. Complete this exercise __________ times per day.  STRENGTH - Hip Abductors, Quadruped  On a firm, lightly padded surface, position yourself on your hands and knees. Your hands should be directly below your shoulders and your knees should be directly below your hips.  Keeping your right / left knee bent, lift your leg out to the side. Keep your legs level and in line with your shoulders.  Position yourself on your hands and knees.  Hold for __________ seconds.  Keeping your trunk steady and your hips level, slowly lower your leg to the starting position. Repeat __________ times. Complete this exercise __________ times per day.  STRENGTH - Hip Abductors, Standing  Tie one end of a rubber exercise band/tubing to a secure surface (table, pole) and tie a loop at the other end.  Place the loop around your right / left ankle. Keeping your ankle with the band directly opposite of the secured end, step away until there is tension in the tube/band.  Hold onto a chair as needed for balance.  Keeping your back upright, your shoulders over your hips, and your toes pointing forward, lift your right / left leg out to your side. Be sure to lift your leg with your hip muscles. Do not "throw" your leg or tip your body to lift your leg.  Slowly and with control, return to the starting position. Repeat exercise __________ times. Complete this exercise __________ times per day.  Document Released: 04/23/2005 Document Revised: 09/07/2013 Document Reviewed: 08/05/2008 [facility] Patient Information 2015 [place], [place]. This information is not intended to replace advice given to you by your health care provider. Make sure you discuss any questions you have with your health care provider.  Radicular Pain Radicular pain in either the arm or leg is usually from a bulging or herniated disk in the spine. A piece of the herniated disk may press against the nerves as the nerves  exit the spine. This causes pain which is felt at the tips of the nerves down the arm or leg. Other causes of radicular pain may include:  Fractures.  Heart disease.  Cancer.  An abnormal and usually degenerative state of the nervous system or nerves (neuropathy). Diagnosis may require CT or MRI scanning to determine the primary cause.  Nerves that start at the neck (nerve roots) may cause radicular pain in the outer shoulder and arm. It can spread down to the thumb and fingers. The symptoms vary depending on which nerve root has been affected. In most cases radicular pain improves with conservative treatment. Neck problems may require physical therapy, a neck collar, or cervical traction. Treatment may take many weeks, and surgery may be considered if the symptoms do not improve.  Conservative treatment is also recommended for sciatica. Sciatica causes pain to radiate from the lower back or buttock area down the leg into the foot. Often there  is a history of back problems. Most patients with sciatica are better after 2 to 4 weeks of rest and other supportive care. Short term bed rest can reduce the disk pressure considerably. Sitting, however, is not a good position since this increases the pressure on the disk. You should avoid bending, lifting, and all other activities which make the problem worse. Traction can be used in severe cases. Surgery is usually reserved for patients who do not improve within the first months of treatment. Only take over-the-counter or prescription medicines for pain, discomfort, or fever as directed by your caregiver. Narcotics and muscle relaxants may help by relieving more severe pain and spasm and by providing mild sedation. Cold or massage can give significant relief. Spinal manipulation is not recommended. It can increase the degree of disc protrusion. Epidural steroid injections are often effective treatment for radicular pain. These injections deliver medicine to the  spinal nerve in the space between the protective covering of the spinal cord and back bones (vertebrae). Your caregiver can give you more information about steroid injections. These injections are most effective when given within two weeks of the onset of pain.  You should see your caregiver for follow up care as recommended. A program for neck and back injury rehabilitation with stretching and strengthening exercises is an important part of management.  SEEK IMMEDIATE MEDICAL CARE IF:  You develop increased pain, weakness, or numbness in your arm or leg.  You develop difficulty with bladder or bowel control.  You develop abdominal pain. Document Released: 05/31/2004 Document Revised: 07/16/2011 Document Reviewed: 08/16/2008 [facility] Patient Information 2015 [place], [place]. This information is not intended to replace advice given to you by your health care provider. Make sure you discuss any questions you have with your health care provider.  Sciatica Sciatica is pain, weakness, numbness, or tingling along your sciatic nerve. The nerve starts in the lower back and runs down the back of each leg. Nerve damage or certain conditions pinch or put pressure on the sciatic nerve. This causes the pain, weakness, and other discomforts of sciatica. HOME CARE   Only take medicine as told by your doctor.  Apply ice to the affected area for 20 minutes. Do this 3-4 times a day for the first 48-72 hours. Then try heat in the same way.  Exercise, stretch, or do your usual activities if these do not make your pain worse.  Go to physical therapy as told by your doctor.  Keep all doctor visits as told.  Do not wear high heels or shoes that are not supportive.  Get a firm mattress if your mattress is too soft to lessen pain and discomfort. GET HELP RIGHT AWAY IF:   You cannot control when you poop (bowel movement) or pee (urinate).  You have more weakness in your lower back, lower belly (pelvis), butt  (buttocks), or legs.  You have redness or puffiness (swelling) of your back.  You have a burning feeling when you pee.  You have pain that gets worse when you lie down.  You have pain that wakes you from your sleep.  Your pain is worse than past pain.  Your pain lasts longer than 4 weeks.  You are suddenly losing weight without reason. MAKE SURE YOU:   Understand these instructions.  Will watch this condition.  Will get help right away if you are not doing well or get worse. Document Released: 01/31/2008 Document Revised: 10/23/2011 Document Reviewed: 09/02/2011 [facility] Patient Information 2015 [place]Care, [place].  This information is not intended to replace advice given to you by your health care provider. Make sure you discuss any questions you have with your health care provider. Emergency Department Resource Guide 1) Find a Doctor and Pay Out of Pocket Although you won't have to find out who is covered by your insurance plan, it is a good idea to ask around and get recommendations. You will then need to call the office and see if the doctor you have chosen will accept you as a new patient and what types of options they offer for patients who are self-pay. Some doctors offer discounts or will set up payment plans for their patients who do not have insurance, but you will need to ask so you aren't surprised when you get to your appointment.  2) Contact Your Local Health Department Not all health departments have doctors that can see patients for sick visits, but many do, so it is worth a call to see if yours does. If you don't know where your local health department is, you can check in your phone book. The CDC also has a tool to help you locate your state's health department, and many state websites also have listings of all of their local health departments.  3) Find a Walk-in Clinic If your illness is not likely to be very severe or complicated, you may want to try a walk in clinic.  These are popping up all over the country in pharmacies, drugstores, and shopping centers. They're usually staffed by nurse practitioners or physician assistants that have been trained to treat common illnesses and complaints. They're usually fairly quick and inexpensive. However, if you have serious medical issues or chronic medical problems, these are probably not your best option.  No Primary Care Doctor: - Call [facility] at  [phone] - they can help you locate a primary care doctor that  accepts your insurance, provides certain services, etc. - Physician Referral Service- [phone]  Chronic Pain Problems: Organization         Address  Phone   Notes  [facility]  [phone] Patients need to be referred by their primary care doctor.   Medication Assistance: Organization         Address  Phone   Notes  [facility] Medication [facility] [address] [place] [place], [place] [address] [phone] --Must be a resident of [facility] -- Must have NO insurance coverage whatsoever (no Medicaid/ Medicare, etc.) -- The pt. MUST have a primary care doctor that directs their care regularly and follows them in the community   [facility]  [phone]   [employer]  [phone]    Agencies that provide inexpensive medical care: Organization         Address  Phone   Notes  [facility]  [phone]   [facility]    [phone]   [facility] [address] [employer], [place] [address] [phone]   [facility] [address] [place] [phone]   [facility]    [phone]   [facility]    [phone]   [facility]  [address], [place] Phone:  [phone], Fax:  [phone] Hours of Operation:  9 am - 6 pm, M-F.  Also accepts Medicaid/Medicare and self-pay.  [facility]  [address] [place],  [address], [place] Phone: [phone], Fax: [phone]. Hours of Operation:  8:30 am - 5:30 pm, M-F.  Also accepts Medicaid and self-pay.  [facility] [place] [address] [place] Phone: [phone]   [facility] [address] Natasha Bence [place], [place] [phone], Ext. [phone] Mondays & Thursdays: 7-9 AM.  First 15 patients are seen on a first come, first serve basis.    Medicaid-accepting [facility] Providers:  Organization         Address  Phone   Notes  [facility] [address], [place] [phone] Also accepts self-pay patients.  [facility] [address] [place], [place]  [phone]   [facility] [address], [place] [phone]   [facility] [address] [place] [phone]   Renaye Rakers [address], [place]   [phone] Only accepts [place] patients after they have their name applied to their card.   Self-Pay (no insurance) in [facility]:  Organization         Address  Phone   Notes  Sickle Cell Patients, [facility] [address] [place], [place] [phone]   [facility] [address] [place], [place] [phone]   [facility] [place]  [address], [place] [phone]   [facility]/Dr. Osei-Bonsu  [address] [place] or [address], [place] [phone] Phone number for both [place] and [place] locations is the same.  [facility] [address], [place] [phone]   [facility] [address] [place] or [address] [phone]   [facility] [address] [place] [phone], phone; [phone], fax Sees patients 1st  and 3rd Saturday of every month.  Must not qualify for public or private insurance (i.e. Medicaid, Medicare, [place] Health Choice, Veterans' Benefits)  Household income should be no more than 200% of the poverty level The clinic cannot treat you if you are pregnant or think you are pregnant  Sexually transmitted diseases are not treated at the clinic.

## 2014-02-15 NOTE — ED Provider Notes (Signed)
CSN: [id number]     Arrival date & time 02/15/14  1327 History  This chart was scribed for Cel[employer] CorporationMercedes Strupp Camprubi-Soms, PA-C with Hurman HornJohn M Bednar, MD by Tonye RoyaltyJoshua Chen, ED Scribe. This patient was seen in room WTR5/WTR5 and the patient's care was started at 5:10 PM.    Chief Complaint  Patient presents with  . Hip Pain  . buttocks pain    Patient is a 33 y.o. male presenting with hip pain. The history is provided by the patient. No language interpreter was used.  Hip Pain This is a new problem. The current episode started more than 1 week ago. The problem occurs daily. The problem has been unchanged. Pertinent negatives include no chest pain and no abdominal pain. The symptoms are aggravated by twisting (moving in certain ways, including twisting and lifting his R leg (flexion)). Nothing relieves the symptoms. He has tried NSAIDs (Ibuprofen) for the symptoms. The treatment provided no relief.  Hip Pain This is a new problem. The current episode started more than 1 week ago. The problem occurs daily. The problem has been unchanged. Associated symptoms include arthralgias and myalgias. Pertinent negatives include no abdominal pain, change in bowel habit, chest pain, chills, fever, joint swelling, nausea, neck pain, numbness, urinary symptoms, vomiting or weakness. The symptoms are aggravated by twisting (moving in certain ways, including twisting and lifting his R leg (flexion)). He has tried NSAIDs (Ibuprofen) for the symptoms. The treatment provided no relief.   HPI Comments: Matthew Mcfarland is a 33 y.o. male who presents to the Emergency Department complaining of pain to his right hip and buttocks with onset 1 week ago. He states it does not feel like a muscle pain. He reports shooting pain from his hip down his leg when he moves a certain way. He describes it as sharp, shooting, and rates it at 8/10. He states the pain is intermittent. He reports using Ibuprofen without improvement. He reports recently  moving sheet rock up a ladder and state pain began the night after that. He reports tingling to his leg but denies numbness. He denies history of cancer, hypertension, or diabetes. He denies one-sided weakness, back pain, groin pain, problem walking, abdominal pain, nausea, vomiting, fever, incontinence, groin numbness, dysuria, difficulty urinating, or hematuria.  Past Medical History  Diagnosis Date  . Asthma    History reviewed. No pertinent past surgical history. Family History  Problem Relation Age of Onset  . Diabetes Father   . Seizures Father    History  Substance Use Topics  . Smoking status: Current Every Day Smoker -- 0.50 packs/day    Types: Cigarettes  . Smokeless tobacco: Not on file  . Alcohol Use: No    Review of Systems  Constitutional: Negative for fever and chills.  Cardiovascular: Negative for chest pain.  Gastrointestinal: Negative for nausea, vomiting, abdominal pain, diarrhea and change in bowel habit.  Genitourinary: Negative for dysuria, urgency, frequency, hematuria, flank pain, discharge, scrotal swelling, difficulty urinating, penile pain and testicular pain.  Musculoskeletal: Positive for arthralgias and myalgias. Negative for back pain, gait problem, joint swelling and neck pain.       Pain to right hip and buttock shooting down his posterior thigh  Skin: Negative for color change.  Neurological: Negative for weakness and numbness.       + intermittent tingling, denies incontinence   A complete 10 system review of systems was obtained and all systems are negative except as noted in the HPI and PMH.  Allergies  Review of patient's allergies indicates no known allergies.  Home Medications   Prior to Admission medications   Medication Sig Start Date End Date Taking? Authorizing Provider  ibuprofen (ADVIL,MOTRIN) 200 MG tablet Take 800 mg by mouth every 6 (six) hours as needed. Back pain.   Yes Historical Provider, MD  Ibuprofen-Diphenhydramine HCl  (ADVIL PM) 200-25 MG CAPS Take 1 tablet by mouth daily as needed. For pain   Yes Historical Provider, MD  oxymetazoline (AFRIN) 0.05 % nasal spray Place 2 sprays into the nose 2 (two) times daily.   Yes Historical Provider, MD   BP 128/81  Pulse 95  Temp(Src) 98.6 F (37 C) (Oral)  Resp 20  SpO2 97% Physical Exam  Nursing note and vitals reviewed. Constitutional: He is oriented to person, place, and time. Vital signs are normal. He appears well-developed and well-nourished. No distress.  VSS, NAD  HENT:  Head: Normocephalic and atraumatic.  Mouth/Throat: Mucous membranes are normal.  Eyes: Conjunctivae and EOM are normal. Right eye exhibits no discharge. Left eye exhibits no discharge.  Neck: Normal range of motion. Neck supple.  Cardiovascular: Normal rate and intact distal pulses.   Distal pulses intact  Pulmonary/Chest: Effort normal. No respiratory distress.  Abdominal: Normal appearance. He exhibits no distension.  Musculoskeletal: Normal range of motion.       Right hip: He exhibits normal range of motion, normal strength, no tenderness, no bony tenderness, no swelling and no crepitus.       Right knee: Normal.       Lumbar back: Normal.  R hip with FROM intact, no bony or muscular TTP, no spasms noted. No crepitus or deformity. Pain elicited with active hip flexion, but unable to elicit pain with palpation. Pt points to pain in R posterior buttock, states it radiates posteriorly into thigh, but unable to elicit this on exam. +SLR on R. All spinal levels with FROM intact, no TTP or spasm. Strength 5/5 in all extremities, sensation grossly intact in all extremities, gait WNL  Neurological: He is alert and oriented to person, place, and time. He has normal strength. No sensory deficit. Gait normal.  Skin: Skin is warm, dry and intact. No rash noted.  Psychiatric: He has a normal mood and affect.    ED Course  Procedures (including critical care time) Labs Review Labs Reviewed -  No data to display  Imaging Review No results found.   EKG Interpretation None     DIAGNOSTIC STUDIES: Oxygen Saturation is 97% on room air, normal by my interpretation.    COORDINATION OF CARE: 5:19 PM Discussed with patient the possibility of piriformis syndrome and instructions to use anti-inflammatory, heat, and massages. Also instructed him to decrease heavy lifting. The patient agrees with the plan and has no further questions at this time.    MDM   Final diagnoses:  Piriformis syndrome of right side  Sciatica, right    33y/o male with piriformis pain and some sciatica like symptoms with activation of piriformis. No back pain or cord compression symptoms. Neurovascularly intact and gait WNL. Doubt need for emergent imaging at this time. Discussed symptomatic control and heat/massage/exercises to help with this. Discussed use of prednisone for sciatica symptoms, as well as pain meds and muscle relaxant to help with pain and spasm. Discussed f/up with a PCP from resource guide in 1 week. I explained the diagnosis and have given explicit precautions to return to the ER including for any other new or worsening symptoms.  The patient understands and accepts the medical plan as it's been dictated and I have answered their questions. Discharge instructions concerning home care and prescriptions have been given. The patient is STABLE and is discharged to home in good condition.   I personally performed the services described in this documentation, which was scribed in my presence. The recorded information has been reviewed and is accurate.  BP 128/81  Pulse 95  Temp(Src) 98.6 F (37 C) (Oral)  Resp 20  SpO2 97%  Meds ordered this encounter  Medications  . naproxen (NAPROSYN) 500 MG tablet    Sig: Take 1 tablet (500 mg total) by mouth 2 (two) times daily as needed for mild pain, moderate pain or headache (TAKE WITH MEALS.).    Dispense:  20 tablet    Refill:  0    Order Specific  Question:  Supervising Provider    Answer:  Eber Hong D [[id number]]  . HYDROcodone-acetaminophen (NORCO) 5-325 MG per tablet    Sig: Take 1-2 tablets by mouth every 6 (six) hours as needed for severe pain.    Dispense:  10 tablet    Refill:  0    Order Specific Question:  Supervising Provider    Answer:  Eber Hong D [[id number]]  . predniSONE (DELTASONE) 20 MG tablet    Sig: 3 tabs po daily x 4 days    Dispense:  12 tablet    Refill:  0    Order Specific Question:  Supervising Provider    Answer:  Eber Hong D [[id number]]  . cyclobenzaprine (FLEXERIL) 10 MG tablet    Sig: Take 1 tablet (10 mg total) by mouth 3 (three) times daily as needed for muscle spasms.    Dispense:  15 tablet    Refill:  0    Order Specific Question:  Supervising Provider    Answer:  Vida [address]ler [address] [place] Camprubi-Soms, PA-C 02/15/14 1759

## 2014-02-15 NOTE — ED Notes (Signed)
Pt c/o right hip and buttocks pain x 1 week. States prior he was lifting sheet rock.

## 2014-02-15 NOTE — ED Notes (Signed)
Knows not to drink/drive/operate heavy machinery while taking prescribed medications. Resource guide reinforced in detail. No other questions/concerns.

## 2014-02-20 NOTE — ED Provider Notes (Signed)
Medical screening examination/treatment/procedure(s) were performed by non-physician practitioner and as supervising physician I was immediately available for consultation/collaboration.   EKG Interpretation None       Hurman HornJohn M Harsimran Westman, MD 02/20/14 2216

## 2015-01-16 ENCOUNTER — Emergency Department (HOSPITAL_COMMUNITY): Payer: Self-pay

## 2015-01-16 ENCOUNTER — Emergency Department (HOSPITAL_COMMUNITY)
Admission: EM | Admit: 2015-01-16 | Discharge: 2015-01-16 | Disposition: A | Discharge status: Home / Self Care | Payer: Self-pay | Attending: Emergency Medicine | Admitting: Emergency Medicine

## 2015-01-16 ENCOUNTER — Encounter (HOSPITAL_COMMUNITY): Payer: Self-pay

## 2015-01-16 DIAGNOSIS — Z720 Tobacco use: Secondary | ICD-10-CM | POA: Insufficient documentation

## 2015-01-16 DIAGNOSIS — G5601 Carpal tunnel syndrome, right upper limb: Secondary | ICD-10-CM | POA: Insufficient documentation

## 2015-01-16 DIAGNOSIS — R29898 Other symptoms and signs involving the musculoskeletal system: Secondary | ICD-10-CM

## 2015-01-16 DIAGNOSIS — G5602 Carpal tunnel syndrome, left upper limb: Secondary | ICD-10-CM | POA: Insufficient documentation

## 2015-01-16 DIAGNOSIS — G5603 Carpal tunnel syndrome, bilateral upper limbs: Secondary | ICD-10-CM

## 2015-01-16 DIAGNOSIS — Z7982 Long term (current) use of aspirin: Secondary | ICD-10-CM | POA: Insufficient documentation

## 2015-01-16 DIAGNOSIS — Z79899 Other long term (current) drug therapy: Secondary | ICD-10-CM | POA: Insufficient documentation

## 2015-01-16 DIAGNOSIS — J45909 Unspecified asthma, uncomplicated: Secondary | ICD-10-CM | POA: Insufficient documentation

## 2015-01-16 LAB — CBC
HEMATOCRIT: 46.2 % (ref 39.0–52.0)
HEMOGLOBIN: 15.5 g/dL (ref 13.0–17.0)
MCH: 28.8 pg (ref 26.0–34.0)
MCHC: 33.5 g/dL (ref 30.0–36.0)
MCV: 85.9 fL (ref 78.0–100.0)
Platelets: 191 10*3/uL (ref 150–400)
RBC: 5.38 MIL/uL (ref 4.22–5.81)
RDW: 13.3 % (ref 11.5–15.5)
WBC: 7.9 10*3/uL (ref 4.0–10.5)

## 2015-01-16 LAB — BASIC METABOLIC PANEL
ANION GAP: 5 (ref 5–15)
BUN: 10 mg/dL (ref 6–20)
CALCIUM: 8.6 mg/dL — AB (ref 8.9–10.3)
CO2: 24 mmol/L (ref 22–32)
Chloride: 108 mmol/L (ref 101–111)
Creatinine, Ser: 0.96 mg/dL (ref 0.61–1.24)
Glucose, Bld: 104 mg/dL — ABNORMAL HIGH (ref 65–99)
POTASSIUM: 4.3 mmol/L (ref 3.5–5.1)
SODIUM: 137 mmol/L (ref 135–145)

## 2015-01-16 LAB — URINALYSIS, ROUTINE W REFLEX MICROSCOPIC
BILIRUBIN URINE: NEGATIVE
Glucose, UA: NEGATIVE mg/dL
Hgb urine dipstick: NEGATIVE
Ketones, ur: NEGATIVE mg/dL
Leukocytes, UA: NEGATIVE
NITRITE: NEGATIVE
PROTEIN: NEGATIVE mg/dL
SPECIFIC GRAVITY, URINE: 1.031 — AB (ref 1.005–1.030)
UROBILINOGEN UA: 0.2 mg/dL (ref 0.0–1.0)
pH: 5.5 (ref 5.0–8.0)

## 2015-01-16 LAB — CBG MONITORING, ED: GLUCOSE-CAPILLARY: 85 mg/dL (ref 65–99)

## 2015-01-16 MED ORDER — LORAZEPAM 1 MG PO TABS
1.0000 mg | ORAL_TABLET | Freq: Once | ORAL | Status: DC | PRN
Start: 2015-01-16 — End: 2015-01-16
  Administered 2015-01-16: 1 mg via ORAL
  Filled 2015-01-16: qty 1

## 2015-01-16 MED ORDER — MELOXICAM 7.5 MG PO TABS: 7.5000 mg | ORAL_TABLET | Freq: Every day | ORAL | Status: DC

## 2015-01-16 NOTE — Discharge Instructions (Signed)
Read the information below.  Use the prescribed medication as directed.  Please discuss all new medications with your pharmacist.  You may return to the Emergency Department at any time for worsening condition or any new symptoms that concern you.  If you develop uncontrolled pain, weakness or numbness of the extremity, severe discoloration of the skin, or you are unable to use your hands, return to the ER for a recheck.      Carpal Tunnel Syndrome Carpal tunnel syndrome is a disorder of the nervous system in the wrist that causes pain, hand weakness, and/or loss of feeling. Carpal tunnel syndrome is caused by the compression, stretching, or irritation of the median nerve at the wrist joint. Athletes who experience carpal tunnel syndrome may notice a decrease in their performance to the condition, especially for sports that require strong hand or wrist action.  SYMPTOMS   Tingling, numbness, or burning pain in the hand or fingers.  Inability to sleep due to pain in the hand.  Sharp pains that shoot from the wrist up the arm or to the fingers, especially at night.  Morning stiffness or cramping of the hand.  Thumb weakness, resulting in difficulty holding objects or making a fist.  Shiny, dry skin on the hand.  Reduced performance in any sport requiring a strong grip. CAUSES   Median nerve damage at the wrist is caused by pressure due to swelling, inflammation, or scarred tissue.  Sources of pressure include:  Repetitive gripping or squeezing that causes inflammation of the tendon sheaths.  Scarring or shortening of the ligament that covers the median nerve.  Traumatic injury to the wrist or forearm such as fracture, sprain, or dislocation.  Prolonged hyperextension (wrist bent backward) or hyperflexion (wrist bent downward) of the wrist. RISK INCREASES WITH:  Diabetes mellitus.  Menopause or amenorrhea.  Rheumatoid arthritis.  Raynaud disease.  Pregnancy.  Gout.  Kidney  disease.  Ganglion cyst.  Repetitive hand or wrist action.  Hypothyroidism (underactive thyroid gland).  Repetitive jolting or shaking of the hands or wrist.  Prolonged forceful weight-bearing on the hands. PREVENTION  Bracing the hand and wrist straight during activities that involve repetitive grasping.  For activities that require prolonged extension of the wrist (bending towards the top of the forearm) periodically change the position of your wrists.  Learn and use proper technique in activities that result in the wrist position in neutral to slight extension.  Avoid bending the wrist into full extension or flexion (up or down).  Keep the wrist in a straight (neutral) position. To keep the wrist in this position, wear a splint.  Avoid repetitive hand and wrist motions.  When possible avoid prolonged grasping of items (steering wheel of a car, a pen, a vacuum cleaner, or a rake).  Loosen your grip for activities that require prolonged grasping of items.  Place keyboards and writing surfaces at the correct height as to decrease strain on the wrist and hand.  Alternate work tasks to avoid prolonged wrist flexion.  Avoid pinching activities (needlework and writing) as they may irritate your carpal tunnel syndrome.  If these activities are necessary, complete them for shorter periods of time.  When writing, use a felt tip or rollerball pen and/or build up the grip on a pen to decrease the forces required for writing. PROGNOSIS  Carpal tunnel syndrome is usually curable with appropriate conservative treatment and sometimes resolves spontaneously. For some cases, surgery is necessary, especially if muscle wasting or nerve changes have developed.  RELATED COMPLICATIONS   Permanent numbness and a weak thumb or fingers in the affected hand.  Permanent paralysis of a portion of the hand and fingers. TREATMENT  Treatment initially consists of stopping activities that aggravate  the symptoms as well as medication and ice to reduce inflammation. A wrist splint is often recommended for wear during activities of repetitive motion as well as at night. It is also important to learn and use proper technique when performing activities that typically cause pain. On occasion, a corticosteroid injection may be given. If symptoms persist despite conservative treatment, surgery may be an option. Surgical techniques free the pinched or compressed nerve. Carpal tunnel surgery is usually performed on an outpatient basis, meaning you go home the same day as surgery. These procedures provide almost complete relief of all symptoms in 95% of patients. Expect at least 2 weeks for healing after surgery. For cases that are the result of repeated jolting or shaking of the hand or wrist or prolonged hyperextension, surgery is not usually recommended because stretching of the median nerve, not compression, is usually the cause of carpal tunnel syndrome in these cases. MEDICATION   If pain medication is necessary, nonsteroidal anti-inflammatory medications, such as aspirin and ibuprofen, or other minor pain relievers, such as acetaminophen, are often recommended.  Do not take pain medication for 7 days before surgery.  Prescription pain relievers are usually only prescribed after surgery. Use only as directed and only as much as you need.  Corticosteroid injections may be given to reduce inflammation. However, they are not always recommended.  Vitamin B6 (pyridoxine) may reduce symptoms; use only if prescribed for your disorder. SEEK MEDICAL CARE IF:   Symptoms get worse or do not improve in 2 weeks despite treatment.  You also have a current or recent history of neck or shoulder injury that has resulted in pain or tingling elsewhere in your arm. Document Released: 04/23/2005 Document Revised: 09/07/2013 Document Reviewed: 08/05/2008 [facility] Patient Information 2015 [place], [place]. This  information is not intended to replace advice given to you by your health care provider. Make sure you discuss any questions you have with your health care provider.  Wrist Splint A wrist splint is a brace that holds your wrist in a fixed position. It can be used to stabilize your wrist so that broken bones and sprains can heal faster, with less pain. It can also help to relieve pressure on the nerve that runs down the middle of your arm (median nerve). Splints are available in drugstores without a prescription. They are also available by prescription from orthopedic and medical supply stores. Custom splints made from lightweight materials can be made by physical or occupational therapist. HOME CARE INSTRUCTIONS  Wear your splint as instructed by your caregiver. It may be worn while you sleep.  Your caregiver may instruct you how to perform certain exercises at home. These exercises help maintain muscle strength in your hand and wrist. They also help to maintain motion in your fingers. SEEK MEDICAL CARE IF:  You start to lose feeling in your hand or fingers.  Your skin or fingernails turn blue or gray, or they feel cold. MAKE SURE YOU:   Understand these instructions.  Will watch your condition.  Will get help right away if you are not doing well or get worse. Document Released: 04/05/2006 Document Revised: 07/16/2011 Document Reviewed: 08/04/2013 [facility] Patient Information 2015 [place], [place]. This information is not intended to replace advice given to you by your health care  provider. Make sure you discuss any questions you have with your health care provider.   Emergency Department Resource Guide 1) Find a Doctor and Pay Out of Pocket Although you won't have to find out who is covered by your insurance plan, it is a good idea to ask around and get recommendations. You will then need to call the office and see if the doctor you have chosen will accept you as a new patient and what  types of options they offer for patients who are self-pay. Some doctors offer discounts or will set up payment plans for their patients who do not have insurance, but you will need to ask so you aren't surprised when you get to your appointment.  2) Contact Your Local Health Department Not all health departments have doctors that can see patients for sick visits, but many do, so it is worth a call to see if yours does. If you don't know where your local health department is, you can check in your phone book. The CDC also has a tool to help you locate your state's health department, and many state websites also have listings of all of their local health departments.  3) Find a Walk-in Clinic If your illness is not likely to be very severe or complicated, you may want to try a walk in clinic. These are popping up all over the country in pharmacies, drugstores, and shopping centers. They're usually staffed by nurse practitioners or physician assistants that have been trained to treat common illnesses and complaints. They're usually fairly quick and inexpensive. However, if you have serious medical issues or chronic medical problems, these are probably not your best option.  No Primary Care Doctor: - Call Health Connect at  [phone] - they can help you locate a primary care doctor that  accepts your insurance, provides certain services, etc. - Physician Referral Service- [phone]  Chronic Pain Problems: Organization         Address  Phone   Notes  [facility]  [phone] Patients need to be referred by their primary care doctor.   Medication Assistance: Organization         Address  Phone   Notes  [facility] Medication [facility] [address] [place] [place], [place] [address] [phone] --Must be a resident of [facility] -- Must have NO insurance coverage whatsoever (no Medicaid/ Medicare, etc.) -- The pt. MUST have a primary care doctor that  directs their care regularly and follows them in the community   [facility]  [phone]   [employer]  [phone]    Agencies that provide inexpensive medical care: Organization         Address  Phone   Notes  [facility]  [phone]   [facility]    [phone]   [facility] [address] [place], [place] [address] [phone]   [facility] [address] [place], [place] [phone]   Planned Parenthood    [phone]   [facility]    [phone]   [facility] and [facility]  [address], [place] Phone:  [phone], Fax:  [phone] Hours of Operation:  9 am - 6 pm, M-F.  Also accepts Medicaid/Medicare and self-pay.  [facility]  [address], [place] Phone: [phone], Fax: [phone]. Hours of Operation:  8:30 am - 5:30 pm, M-F.  Also accepts Medicaid and self-pay.  [facility] [place] [address] [place] Phone: [phone]   [place], [place], [place] [phone], Ext. [phone] Mondays & Thursdays: 7-9 AM.  First 15 patients are seen on a first come, first serve basis.    [place] Providers:  Organization         Address  Phone   Notes  [facility] [address], [place] [phone] Also accepts self-pay patients.  [facility] [address], [place]  [phone]   [place], [address], [place] [phone]   [facility] [address] [place] [phone]   Lucianne Lei [address], [place]   [phone] Only accepts [place] Access [place] patients after they have their name applied to their card.   Self-Pay (no insurance) in [facility]:  Organization          Address  Phone   Notes  Sickle Cell Patients, [facility] [place] [phone]   [facility] [place] [phone]   [facility] [place]  [place], [place], [place] [phone]   [facility]/Dr. Osei-Bonsu  [address], [place] or [address], [place] [phone] Phone number for both [place] and [place] locations is the same.  Urgent Medical and [facility] [address] [place] [phone]   [facility] [address], [place] or [address] [phone]   [facility] [address], [place] [phone], phone; [phone], fax Sees patients 1st and 3rd Saturday of every month.  Must not qualify for public or private insurance (i.e. Medicaid, Medicare, [place] Health Choice, Veterans' Benefits)  Household income should be no more than 200% of the poverty level The clinic cannot treat you if you are pregnant or think you are pregnant  Sexually transmitted diseases are not treated at the clinic.    Dental Care: Organization         Address  Phone  Notes  [facility] Department of [facility] [phone] Accepts children up to age 29 who are enrolled in [place] or [place]; pregnant women with a Medicaid card; and children who have applied for Medicaid or [place] Health Choice, but were declined, whose parents can pay a reduced fee at time of service.  [facility] of [facility]  [address], [place] [phone] Accepts children up to age 74 who are enrolled in [place] or [place]; pregnant women with a Medicaid card; and children who have applied for Medicaid or [place] Health Choice, but were declined, whose parents can pay a reduced fee at time of  service.  [place] [facility] PRO[facility]  [place] [phone] Patients are seen by appointment only. Walk-ins are not accepted. [place] will see patients 60 years of age and older. Monday - Tuesday (8am-5pm) Most Wednesdays (8:30-5pm) $30 per visit, cash only  [facility] Adult Dental Access PROGRAM  [address], [facility] [phone] Patients are seen by appointment  only. Walk-ins are not accepted. [place] will see patients 47 years of age and older. One Wednesday Evening (Monthly: Volunteer Based).  $30 per visit, cash only  [place]  [phone] for adults; Children under age 66, call Graduate Pediatric Dentistry at [phone]. Children aged 42-14, please call [phone] to request a pediatric application.  Dental services are provided in all areas of dental care including fillings, crowns and bridges, complete and partial dentures, implants, gum treatment, root canals, and extractions. Preventive care is also provided. Treatment is provided to both adults and children. Patients are selected via a lottery and there is often a waiting list.   [facility] [address], [place]  [phone] [email or web address]   [facility] [address] [place], [place] [phone], Ext. [phone] Second and Fourth Thursday of each month, opens at 6:30 AM; Clinic ends at 9 AM.  Patients are seen on a first-come first-served basis, and a limited number are seen during each clinic.   [facility]  [address] Hillard Danker [place], [place] [phone]   Eligibility Requirements You must have lived in [place], [place], or [place] counties for at least the last three months.   You cannot be eligible for state or federal sponsored [employer], including [employer], [place], or [employer].   You generally cannot be eligible for healthcare insurance through your employer.     How to apply: Eligibility screenings are held every Tuesday and Wednesday afternoon from 1:00 pm until 4:00 pm. You do not need an appointment for the interview!  [facility] [address], [place], [place]   [place]  [place] Department  [place]  [phone]    Behavioral Health Resources in the Community: Intensive Outpatient Programs Organization         Address  Phone  Notes  [place]. [address], [place], [place] [phone]   [facility] Outpatient [address] [place], [place]   ADS: [facility] [address] [place], [place]   [place] [address],  [place], [place] or [phone]   Substance Abuse Resources Organization         Address  Phone  Notes  [facility] Drug Services  [phone]   [place]  [phone]   [facility]   Chinita Pester  [phone]   Residential & Outpatient Substance Abuse Program  [phone]   Psychological Services Organization         Address  Phone  Notes  [facility]  [place]  [phone]   [place] [address] [place] or [phone]    Mobile Crisis Teams Organization         Address  Phone  Notes  [facility], Mobile Crisis Care Unit  [phone]   [facility]  [address] [place], [place]   [place] Levels [address], [place] [phone]    Self-Help/Support Groups Organization         Address  Phone             Notes  [place]. of [place] - variety of support groups  [place] Call for more information  Narcotics  Anonymous (NA), Caring Services [address]  Dr, [employer] [place]  2 meetings at this location   Residential Sports administrator         Address  Phone  Notes  [facility] [address],    [place]  [phone]   [facility]  [address] [place] [address], [place], [place] [phone]   [facility] [address] [place], [place] [phone] Admissions: 8am-3pm M-F  [facility] [address],    [place], [place] [phone]   [facility] [address] [place], [place], [place] [phone]   [facility] [address],  [place], [place] [phone]   [facility] - Intensive Outpatient [address]ure[address], [place], [place] [phone]   [facility] ([facility] [address] [place]  [place], [place] [phone] or [phone]   [facility] ([facility]) [address] [place], [place] [phone] Accepts Medicaid  [facility] [place] [address],  [place] [phone] Substance Abuse/Addiction Treatment   [facility] Organization         Address  Phone  Notes  [facility]  [phone]   Angie Fava, PhD [address] Ervin Knack [place], [place]   [phone] or [phone]   [facility]al   [address] [place], [place] [phone]   [facility] [address], [place], [place] [phone] Insurance/Medicaid/sponsorship through [facility] and Families [address]                                    [place], [place] [phone] Therapy/tele-psych/case  [facility] [address] [place], [place] [phone]    Dr. Lolly Mustache  [phone]   [facility] [place]  [employer] [facility] Dept. 1) [address] [place] 2) [address], [place] 3)  [address] [place] [address], [place] [phone]  [phone]   [facility] Child Abuse  Hotline 716-744-9295 or [phone] (After Hours)

## 2015-01-16 NOTE — ED Notes (Signed)
Pt c/o BUE weakness and intermittent numbness/tingling x 2 weeks.  Pain score 5/10.  Denies injury.  Pt reports that he was "just working" when symptoms began.  Sts he has been doing the same job x 5 years, but has been working a significant amount of overtime.
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
## 2015-01-16 NOTE — ED Notes (Signed)
Patient transported to MRI 

## 2016-06-18 ENCOUNTER — Ambulatory Visit: Payer: Self-pay | Admitting: Family Medicine

## 2017-05-21 ENCOUNTER — Encounter: Payer: Self-pay | Admitting: Family Medicine

## 2017-05-21 ENCOUNTER — Ambulatory Visit: Payer: BLUE CROSS/BLUE SHIELD | Attending: Family Medicine | Admitting: Family Medicine

## 2017-05-21 VITALS — BP 121/83 | HR 116 | Temp 98.3°F | Resp 18 | Ht 67.0 in | Wt 221.0 lb

## 2017-05-21 DIAGNOSIS — N529 Male erectile dysfunction, unspecified: Secondary | ICD-10-CM | POA: Insufficient documentation

## 2017-05-21 DIAGNOSIS — E119 Type 2 diabetes mellitus without complications: Secondary | ICD-10-CM | POA: Diagnosis not present

## 2017-05-21 DIAGNOSIS — G478 Other sleep disorders: Secondary | ICD-10-CM | POA: Diagnosis not present

## 2017-05-21 DIAGNOSIS — F17200 Nicotine dependence, unspecified, uncomplicated: Secondary | ICD-10-CM

## 2017-05-21 DIAGNOSIS — Z131 Encounter for screening for diabetes mellitus: Secondary | ICD-10-CM

## 2017-05-21 DIAGNOSIS — R5383 Other fatigue: Secondary | ICD-10-CM | POA: Insufficient documentation

## 2017-05-21 DIAGNOSIS — F1011 Alcohol abuse, in remission: Secondary | ICD-10-CM | POA: Insufficient documentation

## 2017-05-21 DIAGNOSIS — Z7982 Long term (current) use of aspirin: Secondary | ICD-10-CM | POA: Diagnosis not present

## 2017-05-21 DIAGNOSIS — G4726 Circadian rhythm sleep disorder, shift work type: Secondary | ICD-10-CM | POA: Diagnosis not present

## 2017-05-21 DIAGNOSIS — L0201 Cutaneous abscess of face: Secondary | ICD-10-CM | POA: Diagnosis not present

## 2017-05-21 LAB — POCT GLYCOSYLATED HEMOGLOBIN (HGB A1C): HEMOGLOBIN A1C: 8.2

## 2017-05-21 MED ORDER — METFORMIN HCL 500 MG PO TABS: 1000.0000 mg | ORAL_TABLET | Freq: Two times a day (BID) | ORAL | 5 refills | Status: DC

## 2017-05-21 MED ORDER — ACCU-CHEK AVIVA PLUS W/DEVICE KIT: 1.0000 | PACK | Freq: Once | 0 refills | Status: AC

## 2017-05-21 MED ORDER — VARENICLINE TARTRATE 0.5 MG X 11 & 1 MG X 42 PO MISC
ORAL | 0 refills | Status: DC
Start: 2017-05-21 — End: 2023-03-27

## 2017-05-21 MED ORDER — SILDENAFIL CITRATE 100 MG PO TABS: 50.0000 mg | ORAL_TABLET | Freq: Every day | ORAL | 3 refills | Status: DC | PRN

## 2017-05-21 MED ORDER — ACCU-CHEK SOFTCLIX LANCET DEV MISC: 0 refills | Status: DC

## 2017-05-21 MED ORDER — MELATONIN 5 MG PO TABS: ORAL_TABLET | ORAL | 0 refills | Status: DC

## 2017-05-21 MED ORDER — GLUCOSE BLOOD VI STRP: ORAL_STRIP | 12 refills | Status: AC

## 2017-05-21 MED ORDER — VARENICLINE TARTRATE 1 MG PO TABS: 1.0000 mg | ORAL_TABLET | Freq: Two times a day (BID) | ORAL | 2 refills | Status: DC

## 2017-05-21 NOTE — Progress Notes (Signed)
Subjective:  Patient ID: Matthew Mcfarland, male    DOB: [birth year]  Age: 37 y.o. MRN: [id number]  CC: Establish Care   HPI Ahan Eisenberger is a 37 y.o male who presents to establish care.    Fatigue, unspecified type  Onset 7 months ago. He reports symptoms of fatigue. He denies any unintentional weight loss, hematuria, melena, anxiety or depression. He reports getting 7 hours of sleep per night. He denies any episodes of apneic episodes or loud snoring. He does reports starting new job working third shift in June.   Erectile dysfunction, unspecified erectile dysfunction type Onset 2 years ago. He reports  Patient states the nature of difficulty is both attaining and maintaining erection. Libido is notaffected. Patient denies history of cardiovascular disease, diabetes mellitus, neurologic disease.and urologic disease. Patient's expectations as to sexual function.  Patient's description of relationship w/partner. Previous treatment of ED none.  Facial abscess Chronic. 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 Symptoms include tenderness and foul smelling drainage when squeezed. He denies any pain or fevers.    Outpatient Medications Prior to Visit  Medication Sig Dispense Refill  . aspirin 81 MG tablet Take 81 mg by mouth once.    . meloxicam (MOBIC) 7.5 MG tablet Take 1 tablet (7.5 mg total) by mouth daily. (Patient not taking: Reported on 05/21/2017) 14 tablet 0   No facility-administered medications prior to visit.     ROS Review of Systems  Constitutional: Negative.   Eyes: Negative.   Respiratory: Negative.   Cardiovascular: Negative.   Gastrointestinal: Negative.   Genitourinary:       Erectile dysfunction   Skin: Positive for wound (lesion-skin mass).  Neurological: Negative for numbness.  Psychiatric/Behavioral: Negative.     Objective:  BP 121/83 (BP Location: Left Arm, Patient Position: Sitting, Cuff Size: Large)   Pulse (!) 116   Temp 98.3 F (36.8 C) (Oral)   Resp 18    Ht '5\' 7"'$  (1.702 m)   Wt 221 lb (100.2 kg)   SpO2 99%   BMI 34.61 kg/m   BP/Weight 05/21/2017 01/16/2015 08/07/4740  Systolic BP 595 - 638  Diastolic BP 83 - 94  Wt. (Lbs) 221 190 -  BMI 34.61 27.26 -     Physical Exam  Constitutional: He is oriented to person, place, and time. He appears well-developed and well-nourished.  HENT:  Head: Normocephalic and atraumatic.    Right Ear: External ear normal.  Left Ear: External ear normal.  Nose: Nose normal.  Mouth/Throat: Oropharynx is clear and moist.  Eyes: Conjunctivae are normal. Pupils are equal, round, and reactive to light.  Neck: Normal range of motion. Neck supple.  Cardiovascular: Normal rate, regular rhythm, normal heart sounds and intact distal pulses.  Pulmonary/Chest: Effort normal and breath sounds normal.  Abdominal: Soft. Bowel sounds are normal.  Lymphadenopathy:    He has no cervical adenopathy.  Neurological: He is alert and oriented to person, place, and time.  Skin: Skin is warm and dry.  Psychiatric: He has a normal mood and affect. He expresses no homicidal and no suicidal ideation. He expresses no suicidal plans and no homicidal plans. He is communicative. He is attentive.  Nursing note and vitals reviewed.    Assessment & Plan:   1. Newly diagnosed diabetes ([place]) Dietary and exercise interventions discussed. Start checking CBG's . Bring glucometer or blood glucose log to next office visit. - CMP and Liver - metFORMIN (GLUCOPHAGE) 500 MG tablet; Take 2 tablets (1,000 mg total) by mouth 2 (  two) times daily with a meal.  Dispense: 60 tablet; Refill: 5 - Blood Glucose Monitoring Suppl (ACCU-CHEK AVIVA PLUS) w/Device KIT; 1 Device by Does not apply route once for 1 dose.  Dispense: 1 kit; Refill: 0 - glucose blood test strip; Use as instructed  Dispense: 100 each; Refill: 12 - Lancet Devices (ACCU-CHEK SOFTCLIX) lancets; Use as instructed  Dispense: 1 each; Refill: 0 - Lipid Panel  2. Fatigue, unspecified  type  - TSH - CBC  3. Shift work sleep disorder  - Melatonin 5 MG TABS; Use as directed.; Refill: 0  4. Erectile dysfunction, unspecified erectile dysfunction type  - POCT glycosylated hemoglobin (Hb A1C) - sildenafil (VIAGRA) 100 MG tablet; Take 0.5-1 tablets (50-100 mg total) by mouth daily as needed for erectile dysfunction.  Dispense: 5 tablet; Refill: 3  5. Screening for diabetes mellitus  - POCT glycosylated hemoglobin (Hb A1C)  6. Ready to quit smoking  - varenicline (CHANTIX STARTING MONTH PAK) 0.5 MG X 11 & 1 MG X 42 tablet; Take one 0.5 mg tablet by mouth once daily for 3 days, then increase to one 0.5 mg tablet twice daily for 4 days, then increase to one 1 mg tablet twice daily.  Dispense: 53 tablet; Refill: 0 - varenicline (CHANTIX CONTINUING MONTH PAK) 1 MG tablet; Take 1 tablet (1 mg total) by mouth 2 (two) times daily.  Dispense: 60 tablet; Refill: 2  7. Facial abscess  - Ambulatory referral to Dermatology       Follow-up: Return in about 2 weeks (around 06/04/2017) for DM.   Alfonse Spruce FNP

## 2017-05-21 NOTE — Patient Instructions (Addendum)
Check blood sugars twice a day: am and pm. Bring glucometer and blood sugar log to next appt.  Type 2 Diabetes Mellitus, Diagnosis, Adult Type 2 diabetes (type 2 diabetes mellitus) is a long-term (chronic) disease. It may be caused by one or both of these problems:  Your body does not make enough of a hormone called insulin.  Your body does not react in a normal way to insulin that it makes.  Insulin lets sugars (glucose) go into cells in the body. This gives you energy. If you have type 2 diabetes, sugars cannot get into cells. This causes high blood sugar (hyperglycemia). Your doctor will set treatment goals for you. Generally, you should have these blood sugar levels:  Before meals (preprandial): 80-130 mg/dL (1.6-1.0 mmol/L).  After meals (postprandial): below 180 mg/dL (10 mmol/L).  A1c (hemoglobin A1c) level: less than 7%.  Follow these instructions at home: Questions to Ask Your Doctor  You may want to ask these questions:  Do I need to meet with a diabetes educator?  Where can I find a support group for people with diabetes?  What equipment will I need to care for myself at home?  What diabetes medicines do I need? When should I take them?  How often do I need to check my blood sugar?  What number can I call if I have questions?  When is my next doctor's visit?  General instructions  Take over-the-counter and prescription medicines only as told by your doctor.  Keep all follow-up visits as told by your doctor. This is important. Contact a doctor if:  Your blood sugar is at or above 240 mg/dL (96.0 mmol/L) for 2 days in a row.  You have been sick or have had a fever for 2 days or more and you are not getting better.  You have any of these problems for more than 6 hours: ? You cannot eat or drink. ? You feel sick to your stomach (nauseous). ? You throw up (vomit). ? You have watery poop (diarrhea). Get help right away if:  Your blood sugar is lower than 54  mg/dL (3 mmol/L).  You get confused.  You have trouble: ? Thinking clearly. ? Breathing.  You have moderate or large ketone levels in your pee (urine). This information is not intended to replace advice given to you by your health care provider. Make sure you discuss any questions you have with your health care provider. Document Released: 01/31/2008 Document Revised: 09/29/2015 Document Reviewed: 05/27/2015 Elsevier Interactive Patient Education  2018 [employer].   Diabetes Mellitus and Nutrition When you have diabetes (diabetes mellitus), it is very important to have healthy eating habits because your blood sugar (glucose) levels are greatly affected by what you eat and drink. Eating healthy foods in the appropriate amounts, at about the same times every day, can help you:  Control your blood glucose.  Lower your risk of heart disease.  Improve your blood pressure.  Reach or maintain a healthy weight.  Every person with diabetes is different, and each person has different needs for a meal plan. Your health care provider may recommend that you work with a diet and nutrition specialist (dietitian) to make a meal plan that is best for you. Your meal plan may vary depending on factors such as:  The calories you need.  The medicines you take.  Your weight.  Your blood glucose, blood pressure, and cholesterol levels.  Your activity level.  Other health conditions you have, such  as heart or kidney disease.  How do carbohydrates affect me? Carbohydrates affect your blood glucose level more than any other type of food. Eating carbohydrates naturally increases the amount of glucose in your blood. Carbohydrate counting is a method for keeping track of how many carbohydrates you eat. Counting carbohydrates is important to keep your blood glucose at a healthy level, especially if you use insulin or take certain oral diabetes medicines. It is important to know how many carbohydrates  you can safely have in each meal. This is different for every person. Your dietitian can help you calculate how many carbohydrates you should have at each meal and for snack. Foods that contain carbohydrates include:  Bread, cereal, rice, pasta, and crackers.  Potatoes and corn.  Peas, beans, and lentils.  Milk and yogurt.  Fruit and juice.  Desserts, such as cakes, cookies, ice cream, and candy.  How does alcohol affect me? Alcohol can cause a sudden decrease in blood glucose (hypoglycemia), especially if you use insulin or take certain oral diabetes medicines. Hypoglycemia can be a life-threatening condition. Symptoms of hypoglycemia (sleepiness, dizziness, and confusion) are similar to symptoms of having too much alcohol. If your health care provider says that alcohol is safe for you, follow these guidelines:  Limit alcohol intake to no more than 1 drink per day for nonpregnant women and 2 drinks per day for men. One drink equals 12 oz of beer, 5 oz of wine, or 1 oz of hard liquor.  Do not drink on an empty stomach.  Keep yourself hydrated with water, diet soda, or unsweetened iced tea.  Keep in mind that regular soda, juice, and other mixers may contain a lot of sugar and must be counted as carbohydrates.  What are tips for following this plan? Reading food labels  Start by checking the serving size on the label. The amount of calories, carbohydrates, fats, and other nutrients listed on the label are based on one serving of the food. Many foods contain more than one serving per package.  Check the total grams (g) of carbohydrates in one serving. You can calculate the number of servings of carbohydrates in one serving by dividing the total carbohydrates by 15. For example, if a food has 30 g of total carbohydrates, it would be equal to 2 servings of carbohydrates.  Check the number of grams (g) of saturated and trans fats in one serving. Choose foods that have low or no amount  of these fats.  Check the number of milligrams (mg) of sodium in one serving. Most people should limit total sodium intake to less than 2,300 mg per day.  Always check the nutrition information of foods labeled as "low-fat" or "nonfat". These foods may be higher in added sugar or refined carbohydrates and should be avoided.  Talk to your dietitian to identify your daily goals for nutrients listed on the label. Shopping  Avoid buying canned, premade, or processed foods. These foods tend to be high in fat, sodium, and added sugar.  Shop around the outside edge of the grocery store. This includes fresh fruits and vegetables, bulk grains, fresh meats, and fresh dairy. Cooking  Use low-heat cooking methods, such as baking, instead of high-heat cooking methods like deep frying.  Cook using healthy oils, such as olive, canola, or sunflower oil.  Avoid cooking with butter, cream, or high-fat meats. Meal planning  Eat meals and snacks regularly, preferably at the same times every day. Avoid going long periods of time without  eating.  Eat foods high in fiber, such as fresh fruits, vegetables, beans, and whole grains. Talk to your dietitian about how many servings of carbohydrates you can eat at each meal.  Eat 4-6 ounces of lean protein each day, such as lean meat, chicken, fish, eggs, or tofu. 1 ounce is equal to 1 ounce of meat, chicken, or fish, 1 egg, or 1/4 cup of tofu.  Eat some foods each day that contain healthy fats, such as avocado, nuts, seeds, and fish. Lifestyle   Check your blood glucose regularly.  Exercise at least 30 minutes 5 or more days each week, or as told by your health care provider.  Take medicines as told by your health care provider.  Do not use any products that contain nicotine or tobacco, such as cigarettes and e-cigarettes. If you need help quitting, ask your health care provider.  Work with a Veterinary surgeoncounselor or diabetes educator to identify strategies to manage  stress and any emotional and social challenges. What are some questions to ask my health care provider?  Do I need to meet with a diabetes educator?  Do I need to meet with a dietitian?  What number can I call if I have questions?  When are the best times to check my blood glucose? Where to find more information:  American Diabetes Association: diabetes.org/food-and-fitness/food  Academy of Nutrition and Dietetics: https://www.vargas.com/www.eatright.org/resources/health/diseases-and-conditions/diabetes  [employer] of Diabetes and Digestive and Kidney Diseases (NIH): FindJewelers.czwww.niddk.nih.gov/health-information/diabetes/overview/diet-eating-physical-activity Summary  A healthy meal plan will help you control your blood glucose and maintain a healthy lifestyle.  Working with a diet and nutrition specialist (dietitian) can help you make a meal plan that is best for you.  Keep in mind that carbohydrates and alcohol have immediate effects on your blood glucose levels. It is important to count carbohydrates and to use alcohol carefully. This information is not intended to replace advice given to you by your health care provider. Make sure you discuss any questions you have with your health care provider. Document Released: 01/18/2005 Document Revised: 05/28/2016 Document Reviewed: 05/28/2016 Elsevier Interactive Patient Education  [employer]2018 Elsevier Inc.

## 2017-05-22 ENCOUNTER — Other Ambulatory Visit: Payer: Self-pay | Admitting: Pharmacist

## 2017-05-22 LAB — CMP AND LIVER
ALBUMIN: 4.7 g/dL (ref 3.5–5.5)
ALK PHOS: 77 IU/L (ref 39–117)
ALT: 61 IU/L — ABNORMAL HIGH (ref 0–44)
AST: 26 IU/L (ref 0–40)
BILIRUBIN TOTAL: 0.3 mg/dL (ref 0.0–1.2)
BILIRUBIN, DIRECT: 0.1 mg/dL (ref 0.00–0.40)
BUN: 13 mg/dL (ref 6–20)
CALCIUM: 9.6 mg/dL (ref 8.7–10.2)
CO2: 21 mmol/L (ref 20–29)
Chloride: 103 mmol/L (ref 96–106)
Creatinine, Ser: 0.94 mg/dL (ref 0.76–1.27)
GFR calc non Af Amer: 105 mL/min/{1.73_m2} (ref >59)
GFR, EST AFRICAN AMERICAN: 122 mL/min/{1.73_m2} (ref >59)
Glucose: 106 mg/dL — ABNORMAL HIGH (ref 65–99)
POTASSIUM: 4.2 mmol/L (ref 3.5–5.2)
Sodium: 141 mmol/L (ref 134–144)
Total Protein: 7.8 g/dL (ref 6.0–8.5)

## 2017-05-22 LAB — TSH: TSH: 1.62 u[IU]/mL (ref 0.450–4.500)

## 2017-05-22 LAB — CBC
HEMOGLOBIN: 15 g/dL (ref 13.0–17.7)
Hematocrit: 43.5 % (ref 37.5–51.0)
MCH: 28.7 pg (ref 26.6–33.0)
MCHC: 34.5 g/dL (ref 31.5–35.7)
MCV: 83 fL (ref 79–97)
Platelets: 289 10*3/uL (ref 150–379)
RBC: 5.22 x10E6/uL (ref 4.14–5.80)
RDW: 13.4 % (ref 12.3–15.4)
WBC: 13.7 10*3/uL — AB (ref 3.4–10.8)

## 2017-05-22 LAB — LIPID PANEL
CHOLESTEROL TOTAL: 177 mg/dL (ref 100–199)
Chol/HDL Ratio: 4 ratio (ref 0.0–5.0)
HDL: 44 mg/dL (ref >39)
LDL CALC: 117 mg/dL — AB (ref 0–99)
Triglycerides: 81 mg/dL (ref 0–149)
VLDL Cholesterol Cal: 16 mg/dL (ref 5–40)

## 2017-05-22 MED ORDER — CONTOUR NEXT MONITOR W/DEVICE KIT: 1.0000 | PACK | Freq: Every day | 0 refills | Status: AC

## 2017-05-22 MED ORDER — BAYER MICROLET LANCETS MISC: 12 refills | Status: DC

## 2017-05-29 ENCOUNTER — Other Ambulatory Visit: Payer: Self-pay | Admitting: Family Medicine

## 2017-05-29 DIAGNOSIS — D72829 Elevated white blood cell count, unspecified: Secondary | ICD-10-CM

## 2017-06-03 ENCOUNTER — Telehealth (INDEPENDENT_AMBULATORY_CARE_PROVIDER_SITE_OTHER): Payer: Self-pay | Admitting: *Deleted

## 2017-06-03 NOTE — Telephone Encounter (Signed)
Patient verified DOB Patient is aware of WBC being elevated and needing to come in for further work up including HIV. Patient has PCP follow up tomorrow already scheduled. Patient is aware of thyroid being normal and lipids being elevated. Patient needs to limit saturated fat intake and recommend follow up in 3 months. No further questions.

## 2017-06-03 NOTE — Telephone Encounter (Signed)
-----   Message from Lizbeth BarkMandesia R Hairston, FNP sent at 05/29/2017 10:43 AM EST ----- Thyroid function normal.  WBC count elevated. Recommend walk in [place] only visit for additional labs inc. HIV screen.  Lipid levels were elevated.This can increase your risk of heart disease overtime. Start eating a diet low in saturated fat. Limit your intake of fried foods, red meats, and whole milk. Increase physical activity. Recommend follow up in 3 months.

## 2017-06-04 ENCOUNTER — Other Ambulatory Visit: Payer: Self-pay

## 2017-06-04 ENCOUNTER — Ambulatory Visit: Payer: BLUE CROSS/BLUE SHIELD | Attending: Family Medicine | Admitting: Family Medicine

## 2017-06-04 ENCOUNTER — Encounter: Payer: Self-pay | Admitting: Family Medicine

## 2017-06-04 VITALS — BP 119/82 | HR 92 | Temp 97.8°F | Resp 16 | Wt 217.6 lb

## 2017-06-04 DIAGNOSIS — E119 Type 2 diabetes mellitus without complications: Secondary | ICD-10-CM | POA: Insufficient documentation

## 2017-06-04 DIAGNOSIS — Z7984 Long term (current) use of oral hypoglycemic drugs: Secondary | ICD-10-CM | POA: Diagnosis not present

## 2017-06-04 DIAGNOSIS — Z7982 Long term (current) use of aspirin: Secondary | ICD-10-CM | POA: Insufficient documentation

## 2017-06-04 LAB — POCT UA - MICROALBUMIN
Albumin/Creatinine Ratio, Urine, POC: 30
CREATININE, POC: 10 mg/dL
Microalbumin Ur, POC: 10 mg/L

## 2017-06-04 LAB — GLUCOSE, POCT (MANUAL RESULT ENTRY): POC GLUCOSE: 84 mg/dL (ref 70–99)

## 2017-06-04 NOTE — Patient Instructions (Signed)

## 2017-06-04 NOTE — Progress Notes (Signed)
Subjective:  Patient ID: Matthew Mcfarland, male    DOB: 11/18/1980  Age: 37 y.o. MRN: [id number]  CC: Diabetes and Follow-up   HPI Matthew Mcfarland presents for follow up. History of newly diagnosed diabetes at prior visit and was started on metformin.  Diabetes    Disease Monitoring  Blood Sugar Ranges:90-130's in am ; 130's-160's pm   Polyuria: no   Visual problems: no   Medication Compliance: yes  Medication Side Effects  Hypoglycemia: no   Preventitive Health Care  Eye Exam: no  Foot Exam: yes  Diet pattern: carb mod  Exercise: yes  Outpatient Medications Prior to Visit  Medication Sig Dispense Refill  . metFORMIN (GLUCOPHAGE) 500 MG tablet Take 2 tablets (1,000 mg total) by mouth 2 (two) times daily with a meal. 60 tablet 5  . aspirin 81 MG tablet Take 81 mg by mouth once.    Marland Kitchen BAYER MICROLET LANCETS lancets Use as instructed 100 each 12  . Blood Glucose Monitoring Suppl (CONTOUR NEXT MONITOR) w/Device KIT 1 strip by Does not apply route daily. 1 kit 0  . glucose blood test strip Use as instructed 100 each 12  . Lancet Devices (ACCU-CHEK SOFTCLIX) lancets Use as instructed 1 each 0  . Melatonin 5 MG TABS Use as directed. (Patient not taking: Reported on 06/04/2017)  0  . sildenafil (VIAGRA) 100 MG tablet Take 0.5-1 tablets (50-100 mg total) by mouth daily as needed for erectile dysfunction. (Patient not taking: Reported on 06/04/2017) 5 tablet 3  . varenicline (CHANTIX CONTINUING MONTH PAK) 1 MG tablet Take 1 tablet (1 mg total) by mouth 2 (two) times daily. 60 tablet 2  . varenicline (CHANTIX STARTING MONTH PAK) 0.5 MG X 11 & 1 MG X 42 tablet Take one 0.5 mg tablet by mouth once daily for 3 days, then increase to one 0.5 mg tablet twice daily for 4 days, then increase to one 1 mg tablet twice daily. (Patient not taking: Reported on 06/04/2017) 53 tablet 0   No facility-administered medications prior to visit.     ROS Review of Systems  Constitutional: Negative.   Eyes:  Negative.   Respiratory: Negative.   Cardiovascular: Negative.   Gastrointestinal: Negative.   Skin: Negative.   Psychiatric/Behavioral: Negative.     Objective:  BP 119/82 (BP Location: Left Arm, Patient Position: Sitting, Cuff Size: Large)   Pulse 92   Temp 97.8 F (36.6 C) (Oral)   Resp 16   Wt 217 lb 9.6 oz (98.7 kg)   SpO2 97%   BMI 34.08 kg/m   BP/Weight 06/04/2017 05/21/2017 5/94/5859  Systolic BP 292 446 -  Diastolic BP 82 83 -  Wt. (Lbs) 217.6 221 190  BMI 34.08 34.61 27.26     Physical Exam  Constitutional: He appears well-developed and well-nourished.  Eyes: Conjunctivae are normal. Pupils are equal, round, and reactive to light.  Cardiovascular: Normal rate, regular rhythm, normal heart sounds and intact distal pulses.  Pulmonary/Chest: Effort normal and breath sounds normal.  Abdominal: Soft. Bowel sounds are normal.  Skin: Skin is warm and dry.  Psychiatric: He has a normal mood and affect.  Nursing note and vitals reviewed.    Diabetic Foot Exam - Simple   Simple Foot Form Diabetic Foot exam was performed with the following findings:  Yes 06/04/2017  3:35 PM  Visual Inspection No deformities, no ulcerations, no other skin breakdown bilaterally:  Yes Sensation Testing Intact to touch and monofilament testing bilaterally:  Yes Pulse Check  Posterior Tibialis and Dorsalis pulse intact bilaterally:  Yes Comments     Assessment & Plan:   1. Type 2 diabetes mellitus without complication, without long-term current use of insulin (HCC) Continue metformin.  - Glucose (CBG) - POCT UA - Microalbumin      Follow-up: Return in about 3 months (around 09/02/2017) for DM.   Alfonse Spruce FNP

## 2017-06-04 NOTE — Progress Notes (Signed)
F/U DM   

## 2017-09-04 ENCOUNTER — Ambulatory Visit: Payer: BLUE CROSS/BLUE SHIELD | Admitting: Nurse Practitioner

## 2017-09-16 ENCOUNTER — Other Ambulatory Visit: Payer: Self-pay

## 2017-09-16 DIAGNOSIS — E119 Type 2 diabetes mellitus without complications: Secondary | ICD-10-CM

## 2017-09-16 MED ORDER — METFORMIN HCL 500 MG PO TABS
1000.0000 mg | ORAL_TABLET | Freq: Two times a day (BID) | ORAL | 3 refills | Status: DC
Start: 2017-09-16 — End: 2018-04-16

## 2017-09-26 ENCOUNTER — Ambulatory Visit (INDEPENDENT_AMBULATORY_CARE_PROVIDER_SITE_OTHER): Payer: Self-pay | Admitting: Nurse Practitioner

## 2017-10-31 ENCOUNTER — Ambulatory Visit: Payer: Self-pay | Admitting: Internal Medicine

## 2018-04-04 MED FILL — SILDENAFIL CITRATE 100 MG T: 100 | 30 days supply | Qty: 5 | Fill #0

## 2018-04-16 ENCOUNTER — Ambulatory Visit: Payer: No Typology Code available for payment source | Attending: Family Medicine | Admitting: Family Medicine

## 2018-04-16 VITALS — BP 139/86 | HR 94 | Temp 98.2°F | Resp 18 | Ht 66.0 in | Wt 210.0 lb

## 2018-04-16 DIAGNOSIS — G479 Sleep disorder, unspecified: Secondary | ICD-10-CM

## 2018-04-16 DIAGNOSIS — E119 Type 2 diabetes mellitus without complications: Secondary | ICD-10-CM

## 2018-04-16 DIAGNOSIS — J351 Hypertrophy of tonsils: Secondary | ICD-10-CM

## 2018-04-16 LAB — POCT GLYCOSYLATED HEMOGLOBIN (HGB A1C): HbA1c, POC (prediabetic range): 6 % (ref 5.7–6.4)

## 2018-04-16 LAB — GLUCOSE, POCT (MANUAL RESULT ENTRY): POC Glucose: 154 mg/dL — AB (ref 70–99)

## 2018-04-16 MED ORDER — METFORMIN HCL 500 MG PO TABS: 1000.0000 mg | ORAL_TABLET | Freq: Two times a day (BID) | ORAL | 6 refills | Status: DC

## 2018-04-16 MED ORDER — ATORVASTATIN CALCIUM 10 MG PO TABS: 10.0000 mg | ORAL_TABLET | Freq: Every day | ORAL | 6 refills | Status: DC
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
## 2018-04-17 LAB — MICROALBUMIN / CREATININE URINE RATIO
Creatinine, Urine: 33.1 mg/dL
Microalb/Creat Ratio: <9.1 mg/g{creat} (ref 0.0–30.0)
Microalbumin, Urine: <3 ug/mL

## 2018-04-17 LAB — COMPREHENSIVE METABOLIC PANEL WITH GFR
ALT: 34 IU/L (ref 0–44)
AST: 17 IU/L (ref 0–40)
Albumin/Globulin Ratio: 1.5 (ref 1.2–2.2)
Albumin: 4.3 g/dL (ref 3.5–5.5)
Alkaline Phosphatase: 56 IU/L (ref 39–117)
BUN/Creatinine Ratio: 10 (ref 9–20)
BUN: 9 mg/dL (ref 6–20)
Bilirubin Total: 0.2 mg/dL (ref 0.0–1.2)
CO2: 22 mmol/L (ref 20–29)
Calcium: 9.4 mg/dL (ref 8.7–10.2)
Chloride: 103 mmol/L (ref 96–106)
Creatinine, Ser: 0.91 mg/dL (ref 0.76–1.27)
GFR calc Af Amer: 126 mL/min/1.73
GFR calc non Af Amer: 109 mL/min/1.73
Globulin, Total: 2.9 g/dL (ref 1.5–4.5)
Glucose: 110 mg/dL — ABNORMAL HIGH (ref 65–99)
Potassium: 4.4 mmol/L (ref 3.5–5.2)
Sodium: 141 mmol/L (ref 134–144)
Total Protein: 7.2 g/dL (ref 6.0–8.5)

## 2018-04-22 ENCOUNTER — Telehealth: Payer: Self-pay | Admitting: *Deleted

## 2018-04-22 NOTE — Telephone Encounter (Signed)
Patient verified DOB Patient is aware of labs being normal including kidney function. No further questions.

## 2018-04-22 NOTE — Telephone Encounter (Signed)
-----   Message from Cain Saupeammie Fulp, MD sent at 04/18/2018  1:10 AM EST ----- Patient has known to be diabetic therefore the glucose of 110 shows good control of blood sugars

## 2018-05-06 ENCOUNTER — Encounter: Payer: Self-pay | Admitting: Family Medicine

## 2018-05-13 ENCOUNTER — Telehealth: Payer: Self-pay | Admitting: Family Medicine

## 2018-05-13 NOTE — Telephone Encounter (Signed)
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

## 2018-05-13 NOTE — Telephone Encounter (Signed)
Patient needs to apply for FMLA through his job for a intermittent medical leave. He may bring the paperwork to the office to be completed and he can file it.

## 2018-05-13 NOTE — Telephone Encounter (Signed)
Pt called to request a Letter be written stating that he has type 2 Diabetes, and at times this has caused him to delay to work or even miss days because he has to come in for appointments, please follow up

## 2018-06-04 NOTE — Telephone Encounter (Signed)
Pt was called for a second time and agreed to bring paperwork, will stop by on 01.30.2020 to drop off

## 2018-06-05 ENCOUNTER — Telehealth: Payer: Self-pay | Admitting: Family Medicine

## 2018-06-05 NOTE — Telephone Encounter (Signed)
Patient called to drop off paperwork because he is unable to do FMLA he brought the documents for the matrix absence. Paperwork will be dropped off in PCP box.

## 2018-06-06 NOTE — Telephone Encounter (Signed)
Paperwork has been placed in clear folder within PCP inbox for review and completion.

## 2018-07-18 MED FILL — ATORVASTATIN 10 MG TABLET: 10 | 30 days supply | Qty: 30 | Fill #0

## 2018-07-18 MED FILL — metFORMIN HCL 500 MG TABS: 500 | 15 days supply | Qty: 60 | Fill #0

## 2018-07-28 NOTE — Telephone Encounter (Signed)
Please contact patient regarding his paperwork. It does not appear that he had the sleep test done to evaluate for diagnosis of sleep apnea. Does he want the paperwork completed regarding his diabetes and if so then does his diabetes affect his ability to do his job or there special accommodations that he needs in order to complete his job due to his diabetes

## 2018-07-31 NOTE — Telephone Encounter (Signed)
Patient verified DOB Patient states he needs the frequency and duration for appointment needs specified for DM. Patient states at times that his sugars are elevated in the morning he is not able to get to work on time.  Patient states when his sugar levels are elevated he can manage most days but other days, he needs a day or two to feel back to normal.  Patient did not have the funds to complete the sleep study so he is requesting paperwork be filled out based on DM. Patient is also requesting chantix if possible to help with smoking.

## 2018-08-04 NOTE — Telephone Encounter (Signed)
Sure

## 2018-08-04 NOTE — Telephone Encounter (Signed)
Would you like him to be placed on the schedule for a phone visit and then luke can also advise via telephone for the chantix

## 2018-08-04 NOTE — Telephone Encounter (Signed)
Patient's last Hgb A1c was 6.0. Can you find out what levels/glucose numbers her describes as being high in the mornings? If he is needing a day or two off of work to feel normal after his blood sugar is high then he likely needs to see an endocrinologist as well. How many days does he think that he misses in a 30 day period (month) due to his diabetes? Also would he be willing to do a phone consult with Franky Macho regarding Chantix use to help with his smoking?

## 2018-08-05 NOTE — Telephone Encounter (Signed)
Please schedule patient a Tele-visit with PCP to discuss paperwork.

## 2018-08-06 ENCOUNTER — Other Ambulatory Visit: Payer: Self-pay

## 2018-08-06 ENCOUNTER — Ambulatory Visit: Payer: No Typology Code available for payment source | Attending: Family Medicine | Admitting: Family Medicine

## 2019-01-02 MED FILL — metFORMIN HCL 500 MG TABS: 500 | 15 days supply | Qty: 60 | Fill #1

## 2019-05-11 MED FILL — FLUoxetine HCL 40 MG CAPS: 40 | 30 days supply | Qty: 30 | Fill #0

## 2019-06-16 MED FILL — FLUoxetine HCL 40 MG CAPS: 40 | 30 days supply | Qty: 30 | Fill #0

## 2019-10-01 MED FILL — CloNIDine HCL 0.1 MG TAB: 0.1 | 30 days supply | Qty: 60 | Fill #0

## 2019-10-01 MED FILL — NALTREXONE 50 MG TABLET: 50 | 30 days supply | Qty: 30 | Fill #0

## 2019-10-01 MED FILL — FLUoxetine HCL 20 MG CAPS: 20 | 30 days supply | Qty: 60 | Fill #0

## 2020-10-04 ENCOUNTER — Other Ambulatory Visit (HOSPITAL_COMMUNITY): Payer: Self-pay

## 2020-10-04 MED ORDER — CARESTART COVID-19 HOME TEST VI KIT
PACK | 0 refills | Status: DC
  Filled 2020-10-04: qty 4, 4d supply, fill #0

## 2020-10-13 ENCOUNTER — Encounter (HOSPITAL_BASED_OUTPATIENT_CLINIC_OR_DEPARTMENT_OTHER): Payer: Self-pay

## 2020-10-13 ENCOUNTER — Ambulatory Visit (HOSPITAL_BASED_OUTPATIENT_CLINIC_OR_DEPARTMENT_OTHER): Payer: No Typology Code available for payment source | Admitting: Nurse Practitioner

## 2020-10-13 NOTE — Progress Notes (Deleted)
Matthew Keeler, DNP, AGNP-c Primary Care Services ______________________________________________________________________________________________________________________________________________  HPI Matthew Mcfarland is a 40 y.o. male presenting to [facility] today to establish care.   Patient Care Team: Matthew Blackbird, MD (Inactive) as PCP - General (Family Medicine) Last CPE: *** Other providers seen: ***  Concerns today:    Narrative: Matthew Mcfarland is {MARITAL STATUS:22092:::1} He has *** children. *** currently living at home. He reports he {IS SAFE:19720} in his current relationships and home environment.  He {Action; does/does not:19097} have a history or partner abuse.  He is currently {DESC; EMPLOYMENT STATUS:32210}.  He endorses {history; activity/diet:30389}.  He {Actions; denies/admits to:5300} nicotine use, {Actions; denies/reports/admits to:19208}{Drug Use:32241}, {Actions; denies/admits to:5300} {Alcohol:20592} He {Sexually Active/[phone]} He {ACTION; IS/IS SUP:10315945} planning pregnancy with his partner in the near future.  Contraceptive options include {contraception:315051} He reports STI history of {STDs:30427:::1} and {Actions; denies/reports/admits to:19208} concerns for STI today.  He {Actions; denies-reports:120008} recent changes to bowel habits, {Actions; denies-reports:120008} recent changes to bladder habits, {Actions; denies-reports:120008} recent changes to skin.  He {Actions; denies-reports:120008} recent mood related changes. PHQ and GAD listed below. PHQ9 Today: Depression screen [facility] 2/9 04/16/2018 06/04/2017 05/21/2017  Decreased Interest 0 0 2  Down, Depressed, Hopeless 0 0 1  PHQ - 2 Score 0 0 3  Altered sleeping 0 0 0  Tired, decreased energy 0 0 2  Change in appetite 0 0 2  Feeling bad or failure about yourself  0 0 0  Trouble concentrating 1 0 -  Moving slowly or  fidgety/restless 0 0 1  Suicidal thoughts 0 0 0  PHQ-9 Score 1 0 8   GAD7 Today: GAD 7 : Generalized Anxiety Score 04/16/2018 06/04/2017 05/21/2017  Nervous, Anxious, on Edge 0 0 1  Control/stop worrying 1 0 1  Worry too much - different things 1 0 1  Trouble relaxing 0 0 0  Restless 0 0 0  Easily annoyed or irritable 1 0 1  Afraid - awful might happen 1 0 0  Total GAD 7 Score 4 0 4    Health Maintenance Due  Topic Date Due   PNEUMOCOCCAL POLYSACCHARIDE VACCINE AGE 56-64 HIGH RISK  Never done   Pneumococcal Vaccine 73-37 Years old (1 - PCV) Never done   OPHTHALMOLOGY EXAM  Never done   HIV Screening  Never done   Hepatitis C Screening  Never done   FOOT EXAM  06/04/2018   HEMOGLOBIN A1C  10/16/2018   URINE MICROALBUMIN  04/17/2019     PMH Past Medical History:  Diagnosis Date   Asthma    Type 2 diabetes mellitus ([place])     ROS All review of systems negative except what is listed in the HPI  PHYSICAL EXAM {PE:3041131:a:"***"} {PHYSICAL EXAM WITH PROVIDER CHOICES:22563}  ASSESSMENT AND PLAN Problem List Items Addressed This Visit   None   Education provided today during visit and on AVS for patient to review at home.  Diet and Exercise recommendations provided.  Current diagnoses and recommendations discussed. HM recommendations reviewed with recommendations.    Outpatient Encounter Medications as of 10/13/2020  Medication Sig   aspirin 81 MG tablet Take 81 mg by mouth once.   atorvastatin (LIPITOR) 10 MG tablet Take 1 tablet (10 mg total) by mouth daily. In the evening to lower cholesterol   BAYER MICROLET LANCETS lancets Use as instructed   Blood Glucose Monitoring Suppl (CONTOUR NEXT MONITOR) w/Device KIT 1 strip by Does not apply route daily.  COVID-19 At Home Antigen Test [facility] COVID-19 HOME TEST) KIT Use as directed   glucose blood test strip Use as instructed   Lancet Devices (ACCU-CHEK SOFTCLIX) lancets Use as instructed   Melatonin 5 MG TABS Use as  directed. (Patient not taking: Reported on 06/04/2017)   metFORMIN (GLUCOPHAGE) 500 MG tablet Take 2 tablets (1,000 mg total) by mouth 2 (two) times daily with a meal.   sildenafil (VIAGRA) 100 MG tablet Take 0.5-1 tablets (50-100 mg total) by mouth daily as needed for erectile dysfunction. (Patient not taking: Reported on 06/04/2017)   varenicline (CHANTIX CONTINUING MONTH PAK) 1 MG tablet Take 1 tablet (1 mg total) by mouth 2 (two) times daily. (Patient not taking: Reported on 04/16/2018)   varenicline (CHANTIX STARTING MONTH PAK) 0.5 MG X 11 & 1 MG X 42 tablet Take one 0.5 mg tablet by mouth once daily for 3 days, then increase to one 0.5 mg tablet twice daily for 4 days, then increase to one 1 mg tablet twice daily. (Patient not taking: Reported on 06/04/2017)   No facility-administered encounter medications on file as of 10/13/2020.    No follow-ups on file.  Time: >50% spent counseling, care coordination, chart review, and documentation.   Matthew Render, DNP, AGNP-c

## 2020-10-26 ENCOUNTER — Ambulatory Visit (HOSPITAL_BASED_OUTPATIENT_CLINIC_OR_DEPARTMENT_OTHER): Payer: No Typology Code available for payment source | Admitting: Family Medicine

## 2020-10-31 ENCOUNTER — Encounter (HOSPITAL_BASED_OUTPATIENT_CLINIC_OR_DEPARTMENT_OTHER): Payer: Self-pay | Admitting: Family Medicine

## 2020-12-13 ENCOUNTER — Ambulatory Visit: Payer: No Typology Code available for payment source | Admitting: Family Medicine

## 2021-10-24 ENCOUNTER — Ambulatory Visit (INDEPENDENT_AMBULATORY_CARE_PROVIDER_SITE_OTHER): Payer: 59 | Admitting: Orthopaedic Surgery

## 2021-10-24 ENCOUNTER — Ambulatory Visit: Payer: Self-pay

## 2021-10-24 ENCOUNTER — Encounter: Payer: Self-pay | Admitting: Orthopaedic Surgery

## 2021-10-24 ENCOUNTER — Ambulatory Visit (INDEPENDENT_AMBULATORY_CARE_PROVIDER_SITE_OTHER): Payer: 59

## 2021-10-24 DIAGNOSIS — M25522 Pain in left elbow: Secondary | ICD-10-CM

## 2021-10-24 DIAGNOSIS — M25521 Pain in right elbow: Secondary | ICD-10-CM

## 2021-10-24 NOTE — Progress Notes (Signed)
Office Visit Note   Patient: Matthew Mcfarland           Date of Birth: [birth year]           MRN: [id number] Visit Date: 10/24/2021              Requested by: Cain Saupe, MD [address], [address] [place],  [place] [address] PCP: Cain Saupe, MD   Assessment & Plan: Visit Diagnoses:  1. Bilateral elbow joint pain     Plan: Impression is bilateral elbow pain.  Findings suspicious for a lateral epicondylitis although symptoms are not classic.  They do seem to be related to activity and localized to the lateral side of the elbow.  We will start with initial treatment for lateral epicondylitis with counterforce brace and/or elbow brace as well as relative rest and home exercises.  X-rays unremarkable and reassuring.  Follow-up if symptoms persist.  Follow-Up Instructions: No follow-ups on file.   Orders:  Orders Placed This Encounter  Procedures   XR Elbow Complete Left (3+View)   XR Elbow Complete Right (3+View)   No orders of the defined types were placed in this encounter.     Procedures: No procedures performed   Clinical Data: No additional findings.   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  Denies any specific injuries.  Has had pain for 2 months off and on.  Left-hand-dominant.  Pain to the lateral elbow worse with lifting and supination.  No numbness and tingling or neck pain.  Review of Systems  Constitutional: Negative.   All other systems reviewed and are negative.    Objective: Vital Signs: There were no vitals taken for this visit.  Physical Exam Vitals and nursing note reviewed.  Constitutional:      Appearance: He is well-developed.  HENT:     Head: Normocephalic and atraumatic.  Eyes:     Pupils: Pupils are equal, round, and reactive to light.  Pulmonary:     Effort:  Pulmonary effort is normal.  Abdominal:     Palpations: Abdomen is soft.  Musculoskeletal:        General: Normal range of motion.     Cervical back: Neck supple.  Skin:    General: Skin is warm.  Neurological:     Mental Status: He is alert and oriented to person, place, and time.  Psychiatric:        Behavior: Behavior normal.        Thought Content: Thought content normal.        Judgment: Judgment normal.     Ortho Exam Examination of bilateral elbows show full range of motion in supination pronation.  Distal biceps asymptomatic.  Medial side of the elbow is unremarkable.  No real tenderness to the lateral elbow.  No pain with resisted long finger extension or wrist extension.  Specialty Comments:  No specialty comments available.  Imaging: XR Elbow Complete Right (3+View)  Result Date: 10/24/2021 No acute or structural abnormalities.  XR Elbow Complete Left (3+View)  Result Date: 10/24/2021 No acute or structural abnormalities.  Small spur to the medial aspect of the coronoid.  Joint spaces well-preserved.    PMFS History: Patient Active Problem List   Diagnosis Date Noted   History of ETOH abuse 05/21/2017   Past  Medical History:  Diagnosis Date   Asthma    Type 2 diabetes mellitus (HCC)     Family History  Problem Relation Age of Onset   Diabetes Father    Seizures Father     Past Surgical History:  Procedure Laterality Date   NO PAST SURGERIES     Social History   Occupational History   Not on file  Tobacco Use   Smoking status: Every Day    Packs/day: 0.50    Types: Cigarettes   Smokeless tobacco: Never  Substance and Sexual Activity   Alcohol use: Yes    Comment: occ   Drug use: No   Sexual activity: Yes

## 2021-11-04 ENCOUNTER — Telehealth: Payer: 59 | Admitting: Emergency Medicine

## 2021-11-04 DIAGNOSIS — M25522 Pain in left elbow: Secondary | ICD-10-CM

## 2021-11-04 MED ORDER — IBUPROFEN 800 MG PO TABS: 800.0000 mg | ORAL_TABLET | Freq: Three times a day (TID) | ORAL | 0 refills | Status: DC | PRN

## 2021-11-04 NOTE — Patient Instructions (Signed)
Matthew Mcfarland, thank you for joining Carvel Getting, NP for today's virtual visit.  While this provider is not your primary care provider (PCP), if your PCP is located in our provider database this encounter information will be shared with them immediately following your visit.  Consent: (Patient) Matthew Mcfarland provided verbal consent for this virtual visit at the beginning of the encounter.  Current Medications:  Current Outpatient Medications:    ibuprofen (ADVIL) 800 MG tablet, Take 1 tablet (800 mg total) by mouth every 8 (eight) hours as needed., Disp: 21 tablet, Rfl: 0   aspirin 81 MG tablet, Take 81 mg by mouth once., Disp: , Rfl:    atorvastatin (LIPITOR) 10 MG tablet, Take 1 tablet (10 mg total) by mouth daily. In the evening to lower cholesterol, Disp: 30 tablet, Rfl: 6   BAYER MICROLET LANCETS lancets, Use as instructed, Disp: 100 each, Rfl: 12   benzonatate (TESSALON) 200 MG capsule, SMARTSIG:1 Capsule(s) By Mouth 2-3 Times Daily PRN, Disp: , Rfl:    Blood Glucose Monitoring Suppl (CONTOUR NEXT MONITOR) w/Device KIT, 1 strip by Does not apply route daily., Disp: 1 kit, Rfl: 0   cetirizine (ZYRTEC) 10 MG tablet, Take 10 mg by mouth daily., Disp: , Rfl:    COVID-19 At Home Antigen Test (CARESTART COVID-19 HOME TEST) KIT, Use as directed, Disp: 4 each, Rfl: 0   glucose blood test strip, Use as instructed, Disp: 100 each, Rfl: 12   Lancet Devices (ACCU-CHEK SOFTCLIX) lancets, Use as instructed, Disp: 1 each, Rfl: 0   Melatonin 5 MG TABS, Use as directed., Disp: , Rfl: 0   metFORMIN (GLUCOPHAGE) 500 MG tablet, Take 2 tablets (1,000 mg total) by mouth 2 (two) times daily with a meal., Disp: 60 tablet, Rfl: 6   sildenafil (VIAGRA) 100 MG tablet, Take 0.5-1 tablets (50-100 mg total) by mouth daily as needed for erectile dysfunction., Disp: 5 tablet, Rfl: 3   varenicline (CHANTIX CONTINUING MONTH PAK) 1 MG tablet, Take 1 tablet (1 mg total) by mouth 2 (two) times daily., Disp: 60 tablet, Rfl:  2   varenicline (CHANTIX STARTING MONTH PAK) 0.5 MG X 11 & 1 MG X 42 tablet, Take one 0.5 mg tablet by mouth once daily for 3 days, then increase to one 0.5 mg tablet twice daily for 4 days, then increase to one 1 mg tablet twice daily., Disp: 53 tablet, Rfl: 0   Medications ordered in this encounter:  Meds ordered this encounter  Medications   ibuprofen (ADVIL) 800 MG tablet    Sig: Take 1 tablet (800 mg total) by mouth every 8 (eight) hours as needed.    Dispense:  21 tablet    Refill:  0     *If you need refills on other medications prior to your next appointment, please contact your pharmacy*  Follow-Up: Call back or seek an in-person evaluation if the symptoms worsen or if the condition fails to improve as anticipated.  Other Instructions Use ice therapy, 15 minutes at a time, several times a day as needed.  Rest your elbow is much as you are able.  You can use ibuprofen- I have sent in the prescription for the prescription strength ibuprofen- to help manage the pain.  Make sure to do the exercises as instructed by Dr. Erlinda Hong.  If he did not give yoiu exercises to do, follow-up in his office on Monday for further instruction   If you have been instructed to have an in-person evaluation today at  a local Urgent Care facility, please use the link below. It will take you to a list of all of our available [facility], including address, phone number and hours of operation. Please do not delay care.  [facility]  If you or a family member do not have a primary care provider, use the link below to schedule a visit and establish care. When you choose a [facility] primary care physician or advanced practice provider, you gain a long-term partner in health. Find a Primary Care Provider  Learn more about [facility] in-office and virtual care options: [facility]

## 2021-11-04 NOTE — Progress Notes (Signed)
Virtual Visit Consent   Matthew Mcfarland, you are scheduled for a virtual visit with a [facility] provider today. Just as with appointments in the office, your consent must be obtained to participate. Your consent will be active for this visit and any virtual visit you may have with one of our providers in the next 365 days. If you have a MyChart account, a copy of this consent can be sent to you electronically.  As this is a virtual visit, video technology does not allow for your provider to perform a traditional examination. This may limit your provider's ability to fully assess your condition. If your provider identifies any concerns that need to be evaluated in person or the need to arrange testing (such as labs, EKG, etc.), we will make arrangements to do so. Although advances in technology are sophisticated, we cannot ensure that it will always work on either your end or our end. If the connection with a video visit is poor, the visit may have to be switched to a telephone visit. With either a video or telephone visit, we are not always able to ensure that we have a secure connection.  By engaging in this virtual visit, you consent to the provision of healthcare and authorize for your insurance to be billed (if applicable) for the services provided during this visit. Depending on your insurance coverage, you may receive a charge related to this service.  I need to obtain your verbal consent now. Are you willing to proceed with your visit today? Matthew Mcfarland has provided verbal consent on 11/04/2021 for a virtual visit (video or telephone). Matthew Getting, NP  Date: 11/04/2021 2:39 PM  Virtual Visit via Video Note   I, Matthew Mcfarland, connected with  Matthew Mcfarland  ([id number], [birth year]) on 11/04/21 at  2:30 PM EDT by a video-enabled telemedicine application and verified that I am speaking with the correct person using two identifiers.  Location: Patient: Virtual Visit Location Patient:  Home Provider: Virtual Visit Location Provider: Home Office   I discussed the limitations of evaluation and management by telemedicine and the availability of in person appointments. The patient expressed understanding and agreed to proceed.    History of Present Illness: Matthew Mcfarland is a 41 y.o. who identifies as a male who was assigned male at birth, and is being seen today for tennis elbow.  Patient reports he saw orthopedist Dr. Erlinda Hong on 10/24/2021 and was told he has tennis elbow.  He has ordered a strap to help with this but it has not arrived yet.  He has been wearing a sleeve of his wife's on his left arm.  The left elbow is bothering him in particular and he reports it is very painful.  He is taken Tylenol on occasion or ibuprofen on occasion for little relief.  He wants to know if there is anything else he can do to manage his pain.  No new problem or injury  HPI: HPI  Problems:  Patient Active Problem List   Diagnosis Date Noted   History of ETOH abuse 05/21/2017    Allergies: No Known Allergies Medications:  Current Outpatient Medications:    ibuprofen (ADVIL) 800 MG tablet, Take 1 tablet (800 mg total) by mouth every 8 (eight) hours as needed., Disp: 21 tablet, Rfl: 0   aspirin 81 MG tablet, Take 81 mg by mouth once., Disp: , Rfl:    atorvastatin (LIPITOR) 10 MG tablet, Take 1 tablet (10 mg total) by mouth daily. In  the evening to lower cholesterol, Disp: 30 tablet, Rfl: 6   BAYER MICROLET LANCETS lancets, Use as instructed, Disp: 100 each, Rfl: 12   benzonatate (TESSALON) 200 MG capsule, SMARTSIG:1 Capsule(s) By Mouth 2-3 Times Daily PRN, Disp: , Rfl:    Blood Glucose Monitoring Suppl (CONTOUR NEXT MONITOR) w/Device KIT, 1 strip by Does not apply route daily., Disp: 1 kit, Rfl: 0   cetirizine (ZYRTEC) 10 MG tablet, Take 10 mg by mouth daily., Disp: , Rfl:    COVID-19 At Home Antigen Test (CARESTART COVID-19 HOME TEST) KIT, Use as directed, Disp: 4 each, Rfl: 0   glucose blood test  strip, Use as instructed, Disp: 100 each, Rfl: 12   Lancet Devices (ACCU-CHEK SOFTCLIX) lancets, Use as instructed, Disp: 1 each, Rfl: 0   Melatonin 5 MG TABS, Use as directed., Disp: , Rfl: 0   metFORMIN (GLUCOPHAGE) 500 MG tablet, Take 2 tablets (1,000 mg total) by mouth 2 (two) times daily with a meal., Disp: 60 tablet, Rfl: 6   sildenafil (VIAGRA) 100 MG tablet, Take 0.5-1 tablets (50-100 mg total) by mouth daily as needed for erectile dysfunction., Disp: 5 tablet, Rfl: 3   varenicline (CHANTIX CONTINUING MONTH PAK) 1 MG tablet, Take 1 tablet (1 mg total) by mouth 2 (two) times daily., Disp: 60 tablet, Rfl: 2   varenicline (CHANTIX STARTING MONTH PAK) 0.5 MG X 11 & 1 MG X 42 tablet, Take one 0.5 mg tablet by mouth once daily for 3 days, then increase to one 0.5 mg tablet twice daily for 4 days, then increase to one 1 mg tablet twice daily., Disp: 53 tablet, Rfl: 0  Observations/Objective: Patient is well-developed, well-nourished in no acute distress.  Resting comfortably  at home.  Head is normocephalic, atraumatic.  No labored breathing.  Speech is clear and coherent with logical content.  Patient is alert and oriented at baseline.    Assessment and Plan: 1. Elbow pain, left  We discussed rest, ice, and NSAIDs to treat tennis elbow.  We discussed how the strap will help him.  I prescribed ibuprofen 800 mg to help him with pain at home in the short-term.  Follow Up Instructions: I discussed the assessment and treatment plan with the patient. The patient was provided an opportunity to ask questions and all were answered. The patient agreed with the plan and demonstrated an understanding of the instructions.  A copy of instructions were sent to the patient via MyChart unless otherwise noted below.    The patient was advised to call back or seek an in-person evaluation if the symptoms worsen or if the condition fails to improve as anticipated.  Time:  I spent 10 minutes with the  patient via telehealth technology discussing the above problems/concerns.    Matthew Getting, NP

## 2022-01-30 ENCOUNTER — Other Ambulatory Visit (HOSPITAL_COMMUNITY): Payer: Self-pay

## 2022-01-31 ENCOUNTER — Other Ambulatory Visit: Payer: Self-pay

## 2022-01-31 ENCOUNTER — Other Ambulatory Visit (HOSPITAL_COMMUNITY): Payer: Self-pay

## 2022-01-31 MED ORDER — ROSUVASTATIN CALCIUM 10 MG PO TABS
10.0000 mg | ORAL_TABLET | Freq: Every day | ORAL | 3 refills | Status: DC
  Filled 2022-01-31: qty 90, 90d supply, fill #0

## 2022-01-31 MED ORDER — ROSUVASTATIN CALCIUM 10 MG PO TABS: 10.0000 mg | ORAL_TABLET | Freq: Every day | ORAL | 3 refills | Status: DC

## 2022-01-31 MED ORDER — METFORMIN HCL 500 MG PO TABS
1000.0000 mg | ORAL_TABLET | Freq: Two times a day (BID) | ORAL | 1 refills | Status: DC
  Filled 2022-01-31: qty 360, 90d supply, fill #0

## 2022-01-31 MED ORDER — METFORMIN HCL 500 MG PO TABS
1000.0000 mg | ORAL_TABLET | Freq: Two times a day (BID) | ORAL | 0 refills | Status: DC
  Filled 2022-01-31 (×4): qty 60, 15d supply, fill #0

## 2022-02-13 ENCOUNTER — Ambulatory Visit: Payer: 59 | Admitting: Critical Care Medicine

## 2022-02-19 ENCOUNTER — Other Ambulatory Visit (HOSPITAL_COMMUNITY): Payer: Self-pay

## 2022-02-19 MED ORDER — METFORMIN HCL ER 500 MG PO TB24
1000.0000 mg | ORAL_TABLET | Freq: Two times a day (BID) | ORAL | 1 refills | Status: DC
  Filled 2022-02-19: qty 360, 90d supply, fill #0

## 2022-02-27 ENCOUNTER — Other Ambulatory Visit (HOSPITAL_COMMUNITY): Payer: Self-pay

## 2022-04-11 ENCOUNTER — Other Ambulatory Visit (HOSPITAL_COMMUNITY): Payer: Self-pay

## 2022-04-11 MED ORDER — NICOTINE POLACRILEX 2 MG MT GUM
CHEWING_GUM | OROMUCOSAL | 1 refills | Status: DC
  Filled 2022-04-11 – 2022-12-19 (×2): qty 110, 36d supply, fill #0

## 2022-04-18 ENCOUNTER — Other Ambulatory Visit (HOSPITAL_COMMUNITY): Payer: Self-pay

## 2022-04-19 ENCOUNTER — Other Ambulatory Visit (HOSPITAL_COMMUNITY): Payer: Self-pay

## 2022-05-24 DIAGNOSIS — E785 Hyperlipidemia, unspecified: Secondary | ICD-10-CM | POA: Diagnosis not present

## 2022-05-24 DIAGNOSIS — F17200 Nicotine dependence, unspecified, uncomplicated: Secondary | ICD-10-CM | POA: Diagnosis not present

## 2022-05-24 DIAGNOSIS — E1165 Type 2 diabetes mellitus with hyperglycemia: Secondary | ICD-10-CM | POA: Diagnosis not present

## 2022-05-24 DIAGNOSIS — E669 Obesity, unspecified: Secondary | ICD-10-CM | POA: Diagnosis not present

## 2022-05-24 DIAGNOSIS — Z6832 Body mass index (BMI) 32.0-32.9, adult: Secondary | ICD-10-CM | POA: Diagnosis not present

## 2022-08-20 ENCOUNTER — Telehealth: Payer: Self-pay

## 2022-08-20 ENCOUNTER — Telehealth: Payer: Self-pay | Admitting: Physician Assistant

## 2022-08-20 NOTE — Telephone Encounter (Signed)
Mardella Layman ask for me to call and schedule patient for an exam. No answer. LMOM for patient to call back for scheduling.

## 2022-08-21 ENCOUNTER — Encounter: Payer: Self-pay | Admitting: Physician Assistant

## 2022-08-21 ENCOUNTER — Ambulatory Visit (INDEPENDENT_AMBULATORY_CARE_PROVIDER_SITE_OTHER): Payer: 59 | Admitting: Orthopaedic Surgery

## 2022-08-21 DIAGNOSIS — M65311 Trigger thumb, right thumb: Secondary | ICD-10-CM | POA: Diagnosis not present

## 2022-08-21 MED ORDER — METHYLPREDNISOLONE ACETATE 40 MG/ML IJ SUSP
13.3300 mg | INTRAMUSCULAR | Status: AC | PRN
  Administered 2022-08-21: 13.33 mg

## 2022-08-21 MED ORDER — LIDOCAINE HCL 1 % IJ SOLN
0.3000 mL | INTRAMUSCULAR | Status: AC | PRN
  Administered 2022-08-21: .3 mL

## 2022-08-21 MED ORDER — BUPIVACAINE HCL 0.5 % IJ SOLN
0.3300 mL | INTRAMUSCULAR | Status: AC | PRN
  Administered 2022-08-21: .33 mL

## 2022-08-21 NOTE — Progress Notes (Signed)
   Office Visit Note   Patient: Matthew Mcfarland           Date of Birth: Feb 25, 1981           MRN: [id number] Visit Date: 08/21/2022              Requested by: Cain Saupe, MD [address] [place],  [place] [address] PCP: Cain Saupe, MD   Assessment & Plan: Visit Diagnoses:  1. Trigger thumb, right thumb     Plan: Impression is right trigger thumb.  We have discussed various treatment options to include cortisone injection and night splinting for which she is agreeable to.  He will follow-up with [place] as needed.  He will continue to manage his diabetes as best as possible.  Follow-Up Instructions: Return if symptoms worsen or fail to improve.   Orders:  No orders of the defined types were placed in this encounter.  No orders of the defined types were placed in this encounter.     Procedures: Hand/UE Inj: R thumb A1 for trigger finger on 08/21/2022 11:49 AM Indications: pain Details: 25 G needle Medications: 0.3 mL lidocaine 1 %; 0.33 mL bupivacaine 0.5 %; 13.33 mg methylPREDNISolone acetate 40 MG/ML Outcome: tolerated well, no immediate complications Consent was given by the patient. Patient was prepped and draped in the usual sterile fashion.       Clinical Data: No additional findings.   Subjective: Chief Complaint  Patient presents with   Right Hand - Pain    Thumb    HPI patient is a pleasant 42 year old left-hand-dominant gentleman who comes in today with right thumb pain and triggering for about a week.  He denies any injury or change in activity but tells me he is diabetic.  He is also on OCT at [facility] [address]    Review of Systems as detailed in HPI.  All others reviewed and are negative.   Objective: Vital Signs: There were no vitals taken for this visit.  Physical Exam well-developed well-nourished gentleman in no acute distress.  Alert and oriented x 3.  Ortho Exam right hand exam reveals mild tenderness in addition to a palpable nodule  at the A1 pulley.  He does exhibit reproducible triggering.  He is neurovascularly intact distally.  Specialty Comments:  No specialty comments available.  Imaging: No new imaging   PMFS History: Patient Active Problem List   Diagnosis Date Noted   History of ETOH abuse 05/21/2017   Past Medical History:  Diagnosis Date   Asthma    Type 2 diabetes mellitus     Family History  Problem Relation Age of Onset   Diabetes Father    Seizures Father     Past Surgical History:  Procedure Laterality Date   NO PAST SURGERIES     Social History   Occupational History   Not on file  Tobacco Use   Smoking status: Every Day    Packs/day: .5    Types: Cigarettes   Smokeless tobacco: Never  Substance and Sexual Activity   Alcohol use: Yes    Comment: occ   Drug use: No   Sexual activity: Yes

## 2022-08-22 ENCOUNTER — Telehealth: Payer: Self-pay | Admitting: Orthopaedic Surgery

## 2022-08-22 NOTE — Telephone Encounter (Signed)
Ok, sounds good.    Lauren, can you type up a note with this restriction?

## 2022-08-22 NOTE — Telephone Encounter (Signed)
No lifting more than 10 lbs for 2 weeks

## 2022-08-22 NOTE — Telephone Encounter (Signed)
Xu, do you want any restrictions for Matthew Mcfarland?

## 2022-08-22 NOTE — Telephone Encounter (Signed)
Uploaded note to Mychart. Called patient and LMOM.

## 2022-08-22 NOTE — Telephone Encounter (Signed)
Patient is asking for a work note for work stating restrictions with hands he works  in the OR and has to move patient from bed to bed and he is having trouble so if we could limit the number of patient he moves and make sure its a suitable amount of time in between transferring patient to give his hand time to rest he states it is very sore moving patients constantly, note is okay to upload Via  MyChart call patient once finished

## 2022-09-03 ENCOUNTER — Ambulatory Visit: Payer: 59 | Admitting: Podiatry

## 2022-09-12 ENCOUNTER — Ambulatory Visit: Payer: 59 | Admitting: Podiatry

## 2022-09-12 DIAGNOSIS — E119 Type 2 diabetes mellitus without complications: Secondary | ICD-10-CM

## 2022-09-12 NOTE — Progress Notes (Signed)
   Chief Complaint  Patient presents with   Foot Problem    diabetic with spot on bottom of left foot, arch of foot, spot has been on foot for weeks, weird sensation when walking, has subsided a lot but would like to still be evaluated     HPI: 42 y.o. male presenting today for evaluation of an asymptomatic bump to the plantar aspect of the left forefoot.  Idiopathic onset.  Denies a history of injury.  He says that he was recently diagnosed with diabetes and would like to have it evaluated.  Past Medical History:  Diagnosis Date   Asthma    Type 2 diabetes mellitus (HCC)     Past Surgical History:  Procedure Laterality Date   NO PAST SURGERIES      No Known Allergies   Physical Exam: General: The patient is alert and oriented x3 in no acute distress.  Dermatology: Skin is warm, dry and supple bilateral lower extremities.   Vascular: Palpable pedal pulses bilaterally. Capillary refill within normal limits.  No appreciable edema.  No erythema.  Neurological: Grossly intact via light touch  Musculoskeletal Exam: No pedal deformities noted.  There is a palpable soft tissue lesion just proximal to the sesamoid apparatus of the left foot.  Nontender.  It measures approximately 1 cm in diameter.  Findings consistent with a plantar fibroma  Assessment/Plan of Care: 1.  Asymptomatic plantar fibroma left 2.  T2DM; controlled  -Patient evaluated.  Comprehensive diabetic foot exam performed today -Continue to simply monitor the plantar fibroma.  Will pursue conservative treatment unless it becomes symptomatic or painful -Continue management of diabetes with PCP.  Advised against going barefoot.  Recommend good supportive shoes and sneakers -Return to clinic annually  *Turnover in the [facility] OR rooms     Felecia Shelling, DPM [facility]  Dr. Felecia Shelling, DPM    [address] [place], [place] [address]                Office  [phone]  Fax [phone]

## 2022-10-25 ENCOUNTER — Other Ambulatory Visit (HOSPITAL_COMMUNITY): Payer: Self-pay

## 2022-11-06 ENCOUNTER — Ambulatory Visit: Payer: 59 | Admitting: Physician Assistant

## 2022-11-06 DIAGNOSIS — M65311 Trigger thumb, right thumb: Secondary | ICD-10-CM | POA: Diagnosis not present

## 2022-11-06 MED ORDER — LIDOCAINE HCL 1 % IJ SOLN
1.0000 mL | INTRAMUSCULAR | Status: AC | PRN
  Administered 2022-11-06: 1 mL

## 2022-11-06 MED ORDER — BUPIVACAINE HCL 0.25 % IJ SOLN
0.3300 mL | INTRAMUSCULAR | Status: AC | PRN
  Administered 2022-11-06: .33 mL

## 2022-11-06 MED ORDER — METHYLPREDNISOLONE ACETATE 40 MG/ML IJ SUSP
13.3300 mg | INTRAMUSCULAR | Status: AC | PRN
  Administered 2022-11-06: 13.33 mg

## 2022-11-06 NOTE — Progress Notes (Signed)
   Office Visit Note   Patient: Matthew Mcfarland           Date of Birth: 10-11-1980           MRN: [id number] Visit Date: 11/06/2022              Requested by: Cain Saupe, MD [address] [place],  [place] [address] PCP: Cain Saupe, MD   Assessment & Plan: Visit Diagnoses:  1. Trigger thumb, right thumb     Plan: Impression is recurrent right trigger thumb.  I did discuss various treatment options again to include repeat cortisone injection versus A1 pulley release.  He would like to try 1 more injection.  He will also splint this at night for another week.  Follow-up as needed.  Follow-Up Instructions: Return if symptoms worsen or fail to improve.   Orders:  Orders Placed This Encounter  Procedures   Hand/UE Inj   No orders of the defined types were placed in this encounter.     Procedures: Hand/UE Inj: R thumb A1 for trigger finger on 11/06/2022 4:11 PM Indications: pain Details: 25 G needle Medications: 1 mL lidocaine 1 %; 0.33 mL bupivacaine 0.25 %; 13.33 mg methylPREDNISolone acetate 40 MG/ML      Clinical Data: No additional findings.   Subjective: Chief Complaint  Patient presents with   Right Hand - Pain    HPI patient is a pleasant 42 year old right-hand-dominant gentleman who comes in today with recurrent pain and triggering to the right thumb.  He was seen in our office little less than 3 months ago with right trigger thumb.  Cortisone injection was performed which helped until recently.  He is having recurrent pain and triggering.  Review of Systems as detailed in HPI.  All others reviewed and are negative.   Objective: Vital Signs: There were no vitals taken for this visit.  Physical Exam well-developed well-nourished gentleman in no acute distress.  Alert and oriented x 3.  Ortho Exam right thumb exam: Reproducible triggering.  He does have pain and tenderness to the A1 pulley.  He is neurovascular intact distally.  Specialty  Comments:  No specialty comments available.  Imaging: No new imaging   PMFS History: Patient Active Problem List   Diagnosis Date Noted   History of ETOH abuse 05/21/2017   Past Medical History:  Diagnosis Date   Asthma    Type 2 diabetes mellitus (HCC)     Family History  Problem Relation Age of Onset   Diabetes Father    Seizures Father     Past Surgical History:  Procedure Laterality Date   NO PAST SURGERIES     Social History   Occupational History   Not on file  Tobacco Use   Smoking status: Every Day    Packs/day: .5    Types: Cigarettes   Smokeless tobacco: Never  Substance and Sexual Activity   Alcohol use: Yes    Comment: occ   Drug use: No   Sexual activity: Yes

## 2022-12-05 ENCOUNTER — Telehealth: Payer: Self-pay

## 2022-12-05 NOTE — Telephone Encounter (Signed)
Pt called and states that she is trying to pul up his bill for appt with Mardella Layman on 11/06/2022. He is not able to see this on mychart and did not know if there was a way that he can find out the amount. Cb [phone] asked if a copy of the bill could even be sent to him in [place]. I advised that I would send a message and someone would call him back to advise how he can locate this information.

## 2022-12-19 ENCOUNTER — Other Ambulatory Visit (HOSPITAL_COMMUNITY): Payer: Self-pay

## 2022-12-21 ENCOUNTER — Telehealth: Payer: 59 | Admitting: Nurse Practitioner

## 2022-12-21 ENCOUNTER — Other Ambulatory Visit (HOSPITAL_COMMUNITY): Payer: Self-pay

## 2022-12-21 DIAGNOSIS — U071 COVID-19: Secondary | ICD-10-CM | POA: Diagnosis not present

## 2022-12-21 MED ORDER — FLUTICASONE PROPIONATE 50 MCG/ACT NA SUSP
2.0000 | Freq: Every day | NASAL | 6 refills | Status: AC
Start: 2022-12-21 — End: ?
  Filled 2022-12-21: qty 16, 30d supply, fill #0

## 2022-12-21 MED ORDER — BENZONATATE 100 MG PO CAPS
100.0000 mg | ORAL_CAPSULE | Freq: Three times a day (TID) | ORAL | 0 refills | Status: DC | PRN
Start: 2022-12-21 — End: 2022-12-22
  Filled 2022-12-21: qty 30, 10d supply, fill #0

## 2022-12-21 NOTE — Progress Notes (Signed)
E-Visit  for Positive Covid Test Result   We are sorry you are not feeling well. We are here to help!  **If you wanted to consider taking anti-viral medications you will need to schedule a video visit.   You have tested positive for COVID-19, meaning that you were infected with the novel coronavirus and could give the virus to others.  Most people with COVID-19 have mild illness and can recover at home without medical care. Do not leave your home, except to get medical care. Do not visit public areas and do not go to places where you are unable to wear a mask. It is important that you stay home  to take care for yourself and to help protect other people in your home and community.      Isolation Instructions:   You are to isolate at home until you have been fever free for at least 24 hours without a fever-reducing medication, and symptoms have been steadily improving for 24 hours. At that time,  you can end isolation but need to mask for an additional 5 days.  If you must be around other household members who do not have symptoms, you need to make sure that both you and the family members are masking consistently with a high-quality mask.  If you note any worsening of symptoms despite treatment, please seek an in-person evaluation ASAP. If you note any significant shortness of breath or any chest pain, please seek ER evaluation. Please do not delay care!   Go to the nearest hospital ED for assessment if fever/cough/breathlessness are severe or illness seems like a threat to life.    The following symptoms may appear 2-14 days after exposure: Fever Cough Shortness of breath or difficulty breathing Chills Repeated shaking with chills Muscle pain Headache Sore throat New loss of taste or smell Fatigue Congestion or runny nose Nausea or vomiting Diarrhea  You can use medication such as prescription cough medication called Tessalon Perles 100 mg. You may take 1-2 capsules every 8  hours as needed for cough and prescription for Fluticasone nasal spray 2 sprays in each nostril one time per day  You may also take acetaminophen (Tylenol) as needed for fever.  HOME CARE: Only take medications as instructed by your medical team. Drink plenty of fluids and get plenty of rest. A steam or ultrasonic humidifier can help if you have congestion.   GET HELP RIGHT AWAY IF YOU HAVE EMERGENCY WARNING SIGNS.  Call 911 or proceed to your closest emergency facility if: You develop worsening high fever. Trouble breathing Bluish lips or face Persistent pain or pressure in the chest New confusion Inability to wake or stay awake You cough up blood. Your symptoms become more severe Inability to hold down food or fluids  This list is not all possible symptoms. Contact your medical provider for any symptoms that are severe or concerning to you.   Your e-visit answers were reviewed by a board certified advanced clinical practitioner to complete your personal care plan.  Depending on the condition, your plan could have included both over the counter or prescription medications.  If there is a problem please reply once you have received a response from your provider.  Your safety is important to [place].  If you have drug allergies check your prescription carefully.    You can use MyChart to ask questions about today's visit, request a non-urgent call back, or ask for a work or school excuse for 24 hours related to  this e-Visit. If it has been greater than 24 hours you will need to follow up with your provider, or enter a new e-Visit to address those concerns. You will get an e-mail in the next two days asking about your experience.  I hope that your e-visit has been valuable and will speed your recovery. Thank you for using e-visits.  I spent approximately 5 minutes reviewing the patient's history, current symptoms and coordinating their care today.

## 2022-12-22 MED ORDER — FLUTICASONE PROPIONATE 50 MCG/ACT NA SUSP
2.0000 | Freq: Every day | NASAL | 6 refills | Status: AC
Start: 2022-12-22 — End: ?
  Filled 2022-12-25: qty 16, 30d supply, fill #0
  Filled 2023-07-18 – 2023-09-19 (×4): qty 16, 30d supply, fill #1

## 2022-12-22 MED ORDER — BENZONATATE 100 MG PO CAPS
100.0000 mg | ORAL_CAPSULE | Freq: Three times a day (TID) | ORAL | 0 refills | Status: DC | PRN
Start: 2022-12-22 — End: 2023-03-27
  Filled 2022-12-25: qty 30, 10d supply, fill #0

## 2022-12-22 NOTE — Addendum Note (Signed)
Addended by: Bennie Pierini on: 12/22/2022 02:48 PM   Modules accepted: Orders

## 2022-12-24 ENCOUNTER — Other Ambulatory Visit (HOSPITAL_COMMUNITY): Payer: Self-pay

## 2022-12-25 ENCOUNTER — Other Ambulatory Visit (HOSPITAL_COMMUNITY): Payer: Self-pay

## 2023-01-15 ENCOUNTER — Ambulatory Visit: Payer: 59 | Admitting: Orthopaedic Surgery

## 2023-01-15 ENCOUNTER — Encounter: Payer: Self-pay | Admitting: Orthopaedic Surgery

## 2023-01-15 DIAGNOSIS — M65311 Trigger thumb, right thumb: Secondary | ICD-10-CM

## 2023-01-15 NOTE — Progress Notes (Signed)
   Office Visit Note   Patient: Matthew Mcfarland           Date of Birth: [birth year]           MRN: [id number] Visit Date: 01/15/2023              Requested by: Cain Saupe, MD [address] [place],  [place] [address] PCP: Cain Saupe, MD   Assessment & Plan: Visit Diagnoses:  1. Trigger thumb, right thumb     Plan: Matthew Mcfarland is a 42 year old gentleman with recurrent right trigger thumb.  He has had 2 injections this year with temporary relief.  At this point based on his options he is elected to move forward with right trigger thumb release.  He will call Debbie to schedule surgery per his convenience and schedule.  Follow-Up Instructions: No follow-ups on file.   Orders:  No orders of the defined types were placed in this encounter.  No orders of the defined types were placed in this encounter.     Procedures: No procedures performed   Clinical Data: No additional findings.   Subjective: Chief Complaint  Patient presents with   Right Hand - Follow-up    Thumb trigger finger    HPI Matthew Mcfarland returns today for recurrent right trigger thumb. Review of Systems   Objective: Vital Signs: There were no vitals taken for this visit.  Physical Exam  Ortho Exam Examination of the right thumb shows a tender nodule over A1 with reproducible catching and locking that produces pain. Specialty Comments:  No specialty comments available.  Imaging: No results found.   PMFS History: Patient Active Problem List   Diagnosis Date Noted   History of ETOH abuse 05/21/2017   Past Medical History:  Diagnosis Date   Asthma    Type 2 diabetes mellitus (HCC)     Family History  Problem Relation Age of Onset   Diabetes Father    Seizures Father     Past Surgical History:  Procedure Laterality Date   NO PAST SURGERIES     Social History   Occupational History   Not on file  Tobacco Use   Smoking status: Every Day    Current packs/day: 0.50    Types:  Cigarettes   Smokeless tobacco: Never  Substance and Sexual Activity   Alcohol use: Yes    Comment: occ   Drug use: No   Sexual activity: Yes

## 2023-01-18 ENCOUNTER — Other Ambulatory Visit (HOSPITAL_COMMUNITY): Payer: Self-pay

## 2023-01-22 ENCOUNTER — Telehealth: Payer: Self-pay | Admitting: Orthopaedic Surgery

## 2023-01-22 NOTE — Telephone Encounter (Signed)
Left message for patient to call to set up right trigger thumb surgery with Dr Roda Shutters.  Provided name and direct number.

## 2023-01-28 ENCOUNTER — Telehealth: Payer: Self-pay | Admitting: Orthopaedic Surgery

## 2023-01-28 NOTE — Telephone Encounter (Signed)
Called patient to offer surgery date but call went straight to voicemail.  Patient has been given name and direct number for contacting our office to schedule.

## 2023-02-06 ENCOUNTER — Telehealth: Payer: Self-pay | Admitting: Orthopaedic Surgery

## 2023-02-06 NOTE — Telephone Encounter (Signed)
Returning patient's call [phone].  Left my name and direct number to be reached.

## 2023-02-11 ENCOUNTER — Other Ambulatory Visit (HOSPITAL_COMMUNITY): Payer: Self-pay

## 2023-02-14 ENCOUNTER — Other Ambulatory Visit (HOSPITAL_COMMUNITY): Payer: Self-pay

## 2023-02-14 ENCOUNTER — Other Ambulatory Visit: Payer: Self-pay | Admitting: Physician Assistant

## 2023-02-14 MED ORDER — TRAMADOL HCL 50 MG PO TABS
50.0000 mg | ORAL_TABLET | Freq: Two times a day (BID) | ORAL | 0 refills | Status: DC | PRN
Start: 2023-02-14 — End: 2023-03-28
  Filled 2023-02-14: qty 30, 15d supply, fill #0

## 2023-02-21 ENCOUNTER — Encounter: Payer: Self-pay | Admitting: Orthopaedic Surgery

## 2023-02-26 ENCOUNTER — Other Ambulatory Visit (HOSPITAL_COMMUNITY): Payer: Self-pay

## 2023-03-04 ENCOUNTER — Telehealth: Payer: Self-pay | Admitting: Orthopaedic Surgery

## 2023-03-04 NOTE — Telephone Encounter (Signed)
[employer] forms received. To [employer]. 

## 2023-03-11 ENCOUNTER — Telehealth: Payer: Self-pay | Admitting: Orthopaedic Surgery

## 2023-03-11 NOTE — Telephone Encounter (Signed)
[employer] forms, auth & $25 cash received. To [employer].

## 2023-03-27 ENCOUNTER — Ambulatory Visit: Payer: 59 | Attending: Family Medicine | Admitting: Family Medicine

## 2023-03-27 ENCOUNTER — Other Ambulatory Visit (HOSPITAL_COMMUNITY): Payer: Self-pay

## 2023-03-27 ENCOUNTER — Encounter: Payer: Self-pay | Admitting: Family Medicine

## 2023-03-27 VITALS — BP 124/85 | HR 103 | Ht 67.5 in | Wt 206.2 lb

## 2023-03-27 DIAGNOSIS — Z7984 Long term (current) use of oral hypoglycemic drugs: Secondary | ICD-10-CM

## 2023-03-27 DIAGNOSIS — J45909 Unspecified asthma, uncomplicated: Secondary | ICD-10-CM | POA: Insufficient documentation

## 2023-03-27 DIAGNOSIS — Z1159 Encounter for screening for other viral diseases: Secondary | ICD-10-CM | POA: Diagnosis not present

## 2023-03-27 DIAGNOSIS — R29818 Other symptoms and signs involving the nervous system: Secondary | ICD-10-CM

## 2023-03-27 DIAGNOSIS — J4520 Mild intermittent asthma, uncomplicated: Secondary | ICD-10-CM | POA: Diagnosis not present

## 2023-03-27 DIAGNOSIS — E1169 Type 2 diabetes mellitus with other specified complication: Secondary | ICD-10-CM

## 2023-03-27 DIAGNOSIS — F419 Anxiety disorder, unspecified: Secondary | ICD-10-CM | POA: Diagnosis not present

## 2023-03-27 DIAGNOSIS — E785 Hyperlipidemia, unspecified: Secondary | ICD-10-CM | POA: Diagnosis not present

## 2023-03-27 DIAGNOSIS — R5383 Other fatigue: Secondary | ICD-10-CM

## 2023-03-27 DIAGNOSIS — Z7985 Long-term (current) use of injectable non-insulin antidiabetic drugs: Secondary | ICD-10-CM

## 2023-03-27 DIAGNOSIS — E119 Type 2 diabetes mellitus without complications: Secondary | ICD-10-CM | POA: Insufficient documentation

## 2023-03-27 LAB — POCT GLYCOSYLATED HEMOGLOBIN (HGB A1C): HbA1c, POC (controlled diabetic range): 9.1 % — AB (ref 0.0–7.0)

## 2023-03-27 MED ORDER — OZEMPIC (0.25 OR 0.5 MG/DOSE) 2 MG/3ML ~~LOC~~ SOPN
0.2500 mg | PEN_INJECTOR | SUBCUTANEOUS | 0 refills | Status: DC
Start: 2023-03-27 — End: 2023-08-12
  Filled 2023-03-27: qty 3, 28d supply, fill #0

## 2023-03-27 MED ORDER — FREESTYLE LANCETS MISC
Freq: Three times a day (TID) | 12 refills | Status: AC
Start: 2023-03-27 — End: ?
  Filled 2023-03-27: qty 100, 30d supply, fill #0
  Filled 2023-07-18: qty 100, 33d supply, fill #0
  Filled 2023-08-14: qty 100, 30d supply, fill #0

## 2023-03-27 MED ORDER — OZEMPIC (0.25 OR 0.5 MG/DOSE) 2 MG/3ML ~~LOC~~ SOPN
0.5000 mg | PEN_INJECTOR | SUBCUTANEOUS | 2 refills | Status: DC
Start: 2023-03-27 — End: 2023-06-27
  Filled 2023-03-27: qty 3, fill #0
  Filled 2023-04-26: qty 3, 28d supply, fill #0
  Filled 2023-06-28: qty 3, 28d supply, fill #1

## 2023-03-27 MED ORDER — ATORVASTATIN CALCIUM 10 MG PO TABS
10.0000 mg | ORAL_TABLET | Freq: Every day | ORAL | 1 refills | Status: AC
Start: 2023-03-27 — End: ?
  Filled 2023-03-27: qty 90, 90d supply, fill #0
  Filled 2023-08-14 – 2023-09-19 (×3): qty 90, 90d supply, fill #1

## 2023-03-27 MED ORDER — METFORMIN HCL ER 500 MG PO TB24
1000.0000 mg | ORAL_TABLET | Freq: Every day | ORAL | 1 refills | Status: AC
  Filled 2023-03-27: qty 180, 90d supply, fill #0
  Filled 2023-08-14: qty 180, 90d supply, fill #1

## 2023-03-27 MED ORDER — FREESTYLE LITE W/DEVICE KIT
PACK | Freq: Three times a day (TID) | 0 refills | Status: AC
Start: 2023-03-27 — End: ?
  Filled 2023-03-27 – 2023-07-18 (×2): qty 1, 30d supply, fill #0
  Filled 2023-08-14: qty 1, 1d supply, fill #0

## 2023-03-27 MED ORDER — GLUCOSE BLOOD VI STRP
ORAL_STRIP | Freq: Three times a day (TID) | 12 refills | Status: AC
  Filled 2023-03-27: qty 100, 30d supply, fill #0
  Filled 2023-07-18: qty 100, 33d supply, fill #0
  Filled 2023-08-14: qty 100, 30d supply, fill #0

## 2023-03-27 NOTE — Patient Instructions (Signed)
VISIT SUMMARY:  Matthew Mcfarland, you visited [place] today due to ongoing fatigue and concerns about possible sleep apnea. We also discussed your type 2 diabetes, hyperlipidemia, tobacco use, and elevated blood pressure. You are scheduled for thumb surgery next week. We have made several adjustments to your medications and ordered tests to better understand your symptoms.  YOUR PLAN:  -TYPE 2 DIABETES MELLITUS: Type 2 diabetes is a condition where your body does not use insulin properly, leading to high blood sugar levels. We have increased your Metformin dose to 1000mg  daily and started you on Ozempic 0.25mg  weekly for 4 weeks, then increasing to 0.5mg  weekly. Please check your blood glucose regularly with the prescribed glucometer.  -HYPERLIPIDEMIA: Hyperlipidemia is having high levels of fats in your blood, which can increase your risk of heart disease. We discussed the importance of taking Atorvastatin to reduce this risk, and you should restart this medication.  -FATIGUE AND POSSIBLE SLEEP APNEA: Sleep apnea is a condition where your breathing stops and starts during sleep, leading to poor sleep quality and daytime fatigue. We have ordered a sleep study to evaluate this and also ordered blood work to check for other causes of your fatigue.  -TOBACCO USE: Smoking can harm your health in many ways, including worsening your diabetes and increasing your risk of heart disease. You are currently smoking about two cigarettes a day and trying to quit. We encourage you to continue your efforts to quit smoking.  -ELEVATED BLOOD PRESSURE: High blood pressure can lead to serious health problems like heart disease and stroke. Your initial blood pressure was high, but it was normal on a repeat measurement. Please avoid coffee and smoking before your next visit, and we will recheck your blood pressure then.  INSTRUCTIONS:  Please follow up in 3 months to reassess your diabetes control, blood pressure, and response to  Ozempic. Additionally, complete the sleep study and blood work as ordered. Avoid coffee and smoking before your next visit.

## 2023-03-27 NOTE — Progress Notes (Signed)
Subjective:  Patient ID: Matthew Mcfarland, male    DOB: 1980/12/21  Age: 42 y.o. MRN: [id number]  CC: New Patient (Initial Visit) (Fatigue)   HPI Matthew Mcfarland is a 42 y.o. year old male with a history of Nicotine dependence (2 Cigarettes/day x 40yrs), type 2 diabetes mellitus, anxiety, asthma.  Interval History: Discussed the use of AI scribe software for clinical note transcription with the patient, who gave verbal consent to proceed.  He presents with ongoing fatigue. He reports feeling "tired of being tired" and struggling to find energy for his daily activities, including caring for his three-year-old child. He has been consuming energy drinks to cope with his fatigue. He suspects that he may have sleep apnea, as he often wakes up at night and experiences headaches during the day. His partner has also informed him that he snores.  In addition to his fatigue, he is scheduled for surgery on his thumb next week.  He has been taking metformin for his diabetes, but there was a period when he was not receiving his medication regularly due to not having a primary care doctor. He has been trying to quit smoking and currently smokes about two cigarettes a day. He used to take fluoxetine for his anxiety but has stopped.  He also does not need medication for asthma symptoms are controlled.       Past Medical History:  Diagnosis Date   Anxiety    Asthma    Type 2 diabetes mellitus (HCC)     Past Surgical History:  Procedure Laterality Date   NO PAST SURGERIES      Family History  Problem Relation Age of Onset   Diabetes Father    Seizures Father     Social History   Socioeconomic History   Marital status: Single    Spouse name: Not on file   Number of children: Not on file   Years of education: Not on file   Highest education level: 12th grade  Occupational History   Not on file  Tobacco Use   Smoking status: Some Days    Current packs/day: 0.50    Types: Cigarettes    Smokeless tobacco: Never  Substance and Sexual Activity   Alcohol use: Not Currently    Comment: occ   Drug use: No   Sexual activity: Yes    Birth control/protection: Condom  Other Topics Concern   Not on file  Social History Narrative   Not on file   Social Determinants of Health   Financial Resource Strain: Patient Declined (03/25/2023)   Overall Financial Resource Strain (CARDIA)    Difficulty of Paying Living Expenses: Patient declined  Food Insecurity: Patient Declined (03/25/2023)   Hunger Vital Sign    Worried About Running Out of Food in the Last Year: Patient declined    Ran Out of Food in the Last Year: Patient declined  Transportation Needs: No Transportation Needs (03/25/2023)   PRAPARE - Administrator, Civil Service (Medical): No    Lack of Transportation (Non-Medical): No  Physical Activity: Insufficiently Active (03/25/2023)   Exercise Vital Sign    Days of Exercise per Week: 2 days    Minutes of Exercise per Session: 10 min  Stress: Stress Concern Present (03/25/2023)   [employer] of Occupational Health - Occupational Stress Questionnaire    Feeling of Stress : To some extent  Social Connections: Unknown (03/25/2023)   Social Connection and Isolation Panel [NHANES]    Frequency of Communication  with Friends and Family: Three times a week    Frequency of Social Gatherings with Friends and Family: Never    Attends Religious Services: Patient declined    Database administrator or Organizations: Patient declined    Attends Engineer, structural: Not on file    Marital Status: Married    No Known Allergies  Outpatient Medications Prior to Visit  Medication Sig Dispense Refill   cetirizine (ZYRTEC) 10 MG tablet Take 10 mg by mouth daily.     fluticasone (FLONASE) 50 MCG/ACT nasal spray Shake liquid and place 2 sprays into both nostrils daily. 16 g 6   traMADol (ULTRAM) 50 MG tablet Take 1 tablet (50 mg total) by mouth 2 (two) times  daily as needed. 30 tablet 0   atorvastatin (LIPITOR) 10 MG tablet Take 1 tablet (10 mg total) by mouth daily. In the evening to lower cholesterol 30 tablet 6   metFORMIN (GLUCOPHAGE) 500 MG tablet Take 2 tablets (1,000 mg total) by mouth 2 (two) times daily with a meal. 60 tablet 6   rosuvastatin (CRESTOR) 10 MG tablet Take 1 tablet (10 mg total) by mouth daily. 90 tablet 3   Blood Glucose Monitoring Suppl (CONTOUR NEXT MONITOR) w/Device KIT 1 strip by Does not apply route daily. (Patient not taking: Reported on 03/27/2023) 1 kit 0   glucose blood test strip Use as instructed (Patient not taking: Reported on 03/27/2023) 100 each 12   aspirin 81 MG tablet Take 81 mg by mouth once. (Patient not taking: Reported on 03/27/2023)     BAYER MICROLET LANCETS lancets Use as instructed (Patient not taking: Reported on 03/27/2023) 100 each 12   benzonatate (TESSALON) 100 MG capsule Take 1 capsule (100 mg total) by mouth 3 (three) times daily as needed. (Patient not taking: Reported on 03/27/2023) 30 capsule 0   benzonatate (TESSALON) 200 MG capsule SMARTSIG:1 Capsule(s) By Mouth 2-3 Times Daily PRN (Patient not taking: Reported on 03/27/2023)     COVID-19 At Home Antigen Test (CARESTART COVID-19 HOME TEST) KIT Use as directed (Patient not taking: Reported on 03/27/2023) 4 each 0   ibuprofen (ADVIL) 800 MG tablet Take 1 tablet (800 mg total) by mouth every 8 (eight) hours as needed. (Patient not taking: Reported on 03/27/2023) 21 tablet 0   Lancet Devices (ACCU-CHEK SOFTCLIX) lancets Use as instructed (Patient not taking: Reported on 03/27/2023) 1 each 0   Melatonin 5 MG TABS Use as directed. (Patient not taking: Reported on 03/27/2023)  0   metFORMIN (GLUCOPHAGE) 500 MG tablet Take 2 tablets (1,000 mg total) by mouth 2 (two) times daily with a meal. (Patient not taking: Reported on 03/27/2023) 60 tablet 0   metFORMIN (GLUCOPHAGE) 500 MG tablet Take 2 tablets (1,000 mg total) by mouth 2 (two) times daily with a  meal. (Patient not taking: Reported on 03/27/2023) 360 tablet 1   metFORMIN (GLUCOPHAGE-XR) 500 MG 24 hr tablet Take 2 tablets (1,000 mg total) by mouth 2 (two) times daily. (Patient not taking: Reported on 03/27/2023) 360 tablet 1   nicotine polacrilex (NICORETTE) 2 MG gum Chew/use 1 piece of gum for 30 minutes every 8 hours as needed (Patient not taking: Reported on 03/27/2023) 110 each 1   rosuvastatin (CRESTOR) 10 MG tablet Take 1 tablet (10 mg total) by mouth daily. (Patient not taking: Reported on 03/27/2023) 90 tablet 3   sildenafil (VIAGRA) 100 MG tablet Take 0.5-1 tablets (50-100 mg total) by mouth daily as needed for erectile dysfunction. (Patient  not taking: Reported on 03/27/2023) 5 tablet 3   varenicline (CHANTIX CONTINUING MONTH PAK) 1 MG tablet Take 1 tablet (1 mg total) by mouth 2 (two) times daily. (Patient not taking: Reported on 03/27/2023) 60 tablet 2   varenicline (CHANTIX STARTING MONTH PAK) 0.5 MG X 11 & 1 MG X 42 tablet Take one 0.5 mg tablet by mouth once daily for 3 days, then increase to one 0.5 mg tablet twice daily for 4 days, then increase to one 1 mg tablet twice daily. (Patient not taking: Reported on 03/27/2023) 53 tablet 0   No facility-administered medications prior to visit.     ROS Review of Systems  Constitutional:  Positive for fatigue. Negative for activity change and appetite change.  HENT:  Negative for sinus pressure and sore throat.   Respiratory:  Negative for chest tightness, shortness of breath and wheezing.   Cardiovascular:  Negative for chest pain and palpitations.  Gastrointestinal:  Negative for abdominal distention, abdominal pain and constipation.  Genitourinary: Negative.   Musculoskeletal: Negative.   Psychiatric/Behavioral:  Negative for behavioral problems and dysphoric mood.     Objective:  BP 124/85   Pulse (!) 103   Ht 5' 7.5" (1.715 m)   Wt 206 lb 3.2 oz (93.5 kg)   SpO2 99%   BMI 31.82 kg/m      03/27/2023    9:45 AM  03/27/2023    9:08 AM 04/16/2018    9:41 AM  BP/Weight  Systolic BP 124 146 139  Diastolic BP 85 94 86  Wt. (Lbs)  206.2 210  BMI  31.82 kg/m2 33.89 kg/m2      Physical Exam Constitutional:      Appearance: He is well-developed.  Cardiovascular:     Rate and Rhythm: Tachycardia present.     Heart sounds: Normal heart sounds. No murmur heard. Pulmonary:     Effort: Pulmonary effort is normal.     Breath sounds: Normal breath sounds. No wheezing or rales.  Chest:     Chest wall: No tenderness.  Abdominal:     General: Bowel sounds are normal. There is no distension.     Palpations: Abdomen is soft. There is no mass.     Tenderness: There is no abdominal tenderness.  Musculoskeletal:        General: Normal range of motion.     Right lower leg: No edema.     Left lower leg: No edema.  Neurological:     Mental Status: He is alert and oriented to person, place, and time.  Psychiatric:        Mood and Affect: Mood normal.        Latest Ref Rng & Units 04/16/2018   12:20 PM 05/21/2017    3:35 PM 01/16/2015    1:42 PM  CMP  Glucose 65 - 99 mg/dL 478  295  621   BUN 6 - 20 mg/dL 9  13  10    Creatinine 0.76 - 1.27 mg/dL 3.08  6.57  8.46   Sodium 134 - 144 mmol/L 141  141  137   Potassium 3.5 - 5.2 mmol/L 4.4  4.2  4.3   Chloride 96 - 106 mmol/L 103  103  108   CO2 20 - 29 mmol/L 22  21  24    Calcium 8.7 - 10.2 mg/dL 9.4  9.6  8.6   Total Protein 6.0 - 8.5 g/dL 7.2  7.8    Total Bilirubin 0.0 - 1.2 mg/dL 0.2  0.3    Alkaline Phos 39 - 117 IU/L 56  77    AST 0 - 40 IU/L 17  26    ALT 0 - 44 IU/L 34  61      Lipid Panel     Component Value Date/Time   CHOL 177 05/21/2017 1535   TRIG 81 05/21/2017 1535   HDL 44 05/21/2017 1535   CHOLHDL 4.0 05/21/2017 1535   LDLCALC 117 (H) 05/21/2017 1535    CBC    Component Value Date/Time   WBC 13.7 (H) 05/21/2017 1535   WBC 7.9 01/16/2015 1342   RBC 5.22 05/21/2017 1535   RBC 5.38 01/16/2015 1342   HGB 15.0 05/21/2017  1535   HCT 43.5 05/21/2017 1535   PLT 289 05/21/2017 1535   MCV 83 05/21/2017 1535   MCH 28.7 05/21/2017 1535   MCH 28.8 01/16/2015 1342   MCHC 34.5 05/21/2017 1535   MCHC 33.5 01/16/2015 1342   RDW 13.4 05/21/2017 1535    Lab Results  Component Value Date   HGBA1C 9.1 (A) 03/27/2023    Assessment & Plan:      Type 2 Diabetes Mellitus Poorly controlled with A1c of 9.1. Patient has been inconsistently taking Metformin 500mg  daily due to lack of primary care follow-up. -Increase Metformin to 1000mg  daily (extended release). -Start Ozempic 0.25mg  weekly for 4 weeks, then increase to 0.5mg  weekly. -Check blood glucose regularly with prescribed glucometer. -Offered to refer for the Liberate study but he declines - Counseled on Diabetic diet, my plate method, 295 minutes of moderate intensity exercise/week Blood sugar logs with fasting goals of 80-120 mg/dl, random of less than 621 and in the event of sugars less than 60 mg/dl or greater than 308 mg/dl encouraged to notify the clinic. Advised on the need for annual eye exams, annual foot exams, Pneumonia vaccine.   Hyperlipidemia Patient has been non-compliant with Atorvastatin due to concerns about hair loss. Discussed the importance of statin therapy in cardiovascular prevention reducing risk of stroke and heart attack in patients with diabetes. -Restart Atorvastatin.  Fatigue and Possible Sleep Apnea Chronic fatigue with possible nocturnal symptoms suggestive of sleep apnea. Patient also reports headaches. -Order sleep study to evaluate for sleep apnea. -Order blood work including CBC, thyroid function tests, and Vitamin D levels to evaluate for other causes of fatigue.  Asthma -Stable with no flares -He is currently not on medications  Anxiety -Controlled -He is currently not on medications  Tobacco Use Patient reports smoking approximately 16 cigarettes per day and is attempting to quit. -Encourage continued efforts to  quit smoking.  Elevated Blood Pressure Initial blood pressure was elevated, but repeat measurement was normal. Patient reports drinking coffee prior to visit. -Advise patient to avoid coffee and smoking prior to next visit. -Recheck blood pressure at next visit.  Follow-up in 3 months to reassess diabetes control, blood pressure, and response to Ozempic.          Meds ordered this encounter  Medications   metFORMIN (GLUCOPHAGE-XR) 500 MG 24 hr tablet    Sig: Take 2 tablets (1,000 mg total) by mouth daily with breakfast.    Dispense:  360 tablet    Refill:  1   Semaglutide,0.25 or 0.5MG /DOS, (OZEMPIC, 0.25 OR 0.5 MG/DOSE,) 2 MG/3ML SOPN    Sig: Inject 0.25 mg into the skin once a week for 4 weeks, then increase to 0.5mg  thereafter.    Dispense:  3 mL    Refill:  0  atorvastatin (LIPITOR) 10 MG tablet    Sig: Take 1 tablet (10 mg total) by mouth daily. In the evening to lower cholesterol    Dispense:  90 tablet    Refill:  1   Semaglutide,0.25 or 0.5MG /DOS, (OZEMPIC, 0.25 OR 0.5 MG/DOSE,) 2 MG/3ML SOPN    Sig: Inject 0.5 mg into the skin once a week.    Dispense:  3 mL    Refill:  2   Blood Glucose Monitoring Suppl (ACCU-CHEK GUIDE) w/Device KIT    Sig: Use as directed to check blood sugar 3 times daily.    Dispense:  1 kit    Refill:  0   Accu-Chek Softclix Lancets lancets    Sig: Use as instructed to check blood sugar 3 times daily before meals    Dispense:  100 each    Refill:  12   glucose blood test strip    Sig: Use as instructed to check blood sugar 3 times daily    Dispense:  100 each    Refill:  12    Follow-up: Return in about 3 months (around 06/27/2023) for Chronic medical conditions.       Hoy Register, MD, FAAFP. [facility] [place], [place] [phone]   03/27/2023, 1:01 PM

## 2023-03-28 ENCOUNTER — Encounter (HOSPITAL_BASED_OUTPATIENT_CLINIC_OR_DEPARTMENT_OTHER)
Admission: RE | Admit: 2023-03-28 | Discharge: 2023-03-28 | Disposition: A | Discharge status: Home / Self Care | Payer: 59 | Source: Ambulatory Visit | Attending: Orthopaedic Surgery | Admitting: Orthopaedic Surgery

## 2023-03-28 ENCOUNTER — Encounter (HOSPITAL_BASED_OUTPATIENT_CLINIC_OR_DEPARTMENT_OTHER): Payer: Self-pay | Admitting: Orthopaedic Surgery

## 2023-03-28 ENCOUNTER — Other Ambulatory Visit: Payer: Self-pay | Admitting: Family Medicine

## 2023-03-28 ENCOUNTER — Other Ambulatory Visit: Payer: Self-pay

## 2023-03-28 DIAGNOSIS — Z01812 Encounter for preprocedural laboratory examination: Secondary | ICD-10-CM | POA: Diagnosis not present

## 2023-03-28 MED ORDER — ERGOCALCIFEROL 1.25 MG (50000 UT) PO CAPS: 50000.0000 [IU] | ORAL_CAPSULE | ORAL | 1 refills | Status: AC

## 2023-03-28 NOTE — Progress Notes (Signed)
   03/28/23 1438  Pre-op Phone Call  Surgery Date Verified 04/03/23  Arrival Time Verified 1030  Surgery Location Verified [facility] [place]  Medical History Reviewed Yes  Is the patient taking a GLP-1 receptor agonist? (S)  Yes  Has the patient been informed on holding medication? (S)  Yes (last dose 11/20)  Does the patient have diabetes? Type II  Does the patient use a Continuous Blood Glucose Monitor? No  Is the patient on an insulin pump? No  Has the diabetes coordinator been notified? No  Do you have a history of heart problems? No  Antiarrhythmic device type  (n/a)  Does patient have other implanted devices? No  Patient Teaching Enhanced Recovery;Pre / Post Procedure  Patient educated about smoking cessation 24 hours prior to surgery. (S)  Yes  Patient verbalizes understanding of bowel prep? N/A  Med Rec Completed Yes  Take the Following Meds the Morning of Surgery hold ozempic until after surgery, no med dos  Recent  Lab Work, EKG, CXR? Yes  NPO (Including gum & candy) After midnight  Allowed clear liquids Water;Gatorade  (diabetics please choose diet or no sugar options)  Patient instructed to stop clear liquids including Carb loading drink at: 0900  Stop Solids, Milk, Candy, and Gum STARTING AT MIDNIGHT  Responsible adult to drive and be with you for 24 hours? Yes  Name & Phone Number for Ride/Caregiver son or wife  No Jewelry, money, nail polish or make-up.  No lotions, powders, perfumes. No shaving  48 hrs. prior to surgery. Yes  Contacts, Dentures & Glasses Will Have to be Removed Before OR. Yes  Please bring your ID and Insurance Card the morning of your surgery. (Surgery Centers Only) Yes  Bring any papers or x-rays with you that your surgeon gave you. Yes  Instructed to contact the location of procedure/ provider if they or anyone in their household develops symptoms or tests positive for COVID-19, has close contact with someone who tests positive for COVID, or has known exposure  to any contagious illness. Yes  Call this number the morning of surgery  with any problems that may cancel your surgery. [phone]

## 2023-03-28 NOTE — Progress Notes (Signed)

## 2023-03-29 LAB — MICROALBUMIN / CREATININE URINE RATIO
Creatinine, Urine: 38.6 mg/dL
Microalb/Creat Ratio: 10 mg/g{creat} (ref 0–29)
Microalbumin, Urine: 3.8 ug/mL

## 2023-04-03 ENCOUNTER — Other Ambulatory Visit: Payer: Self-pay

## 2023-04-03 ENCOUNTER — Ambulatory Visit (HOSPITAL_BASED_OUTPATIENT_CLINIC_OR_DEPARTMENT_OTHER)
Admission: RE | Admit: 2023-04-03 | Discharge: 2023-04-03 | Disposition: A | Discharge status: Home / Self Care | Payer: 59 | Attending: Orthopaedic Surgery | Admitting: Orthopaedic Surgery

## 2023-04-03 ENCOUNTER — Ambulatory Visit (HOSPITAL_BASED_OUTPATIENT_CLINIC_OR_DEPARTMENT_OTHER): Payer: 59 | Admitting: Certified Registered"

## 2023-04-03 ENCOUNTER — Encounter (HOSPITAL_BASED_OUTPATIENT_CLINIC_OR_DEPARTMENT_OTHER): Payer: Self-pay | Admitting: Orthopaedic Surgery

## 2023-04-03 ENCOUNTER — Encounter (HOSPITAL_BASED_OUTPATIENT_CLINIC_OR_DEPARTMENT_OTHER)
Admission: RE | Disposition: A | Discharge status: Home / Self Care | Payer: Self-pay | Source: Home / Self Care | Attending: Orthopaedic Surgery

## 2023-04-03 DIAGNOSIS — Z7984 Long term (current) use of oral hypoglycemic drugs: Secondary | ICD-10-CM | POA: Diagnosis not present

## 2023-04-03 DIAGNOSIS — E1165 Type 2 diabetes mellitus with hyperglycemia: Secondary | ICD-10-CM | POA: Diagnosis not present

## 2023-04-03 DIAGNOSIS — M65311 Trigger thumb, right thumb: Secondary | ICD-10-CM | POA: Diagnosis not present

## 2023-04-03 DIAGNOSIS — Z833 Family history of diabetes mellitus: Secondary | ICD-10-CM | POA: Insufficient documentation

## 2023-04-03 DIAGNOSIS — N319 Neuromuscular dysfunction of bladder, unspecified: Secondary | ICD-10-CM | POA: Insufficient documentation

## 2023-04-03 DIAGNOSIS — Z7985 Long-term (current) use of injectable non-insulin antidiabetic drugs: Secondary | ICD-10-CM | POA: Diagnosis not present

## 2023-04-03 DIAGNOSIS — J45909 Unspecified asthma, uncomplicated: Secondary | ICD-10-CM | POA: Diagnosis not present

## 2023-04-03 DIAGNOSIS — E119 Type 2 diabetes mellitus without complications: Secondary | ICD-10-CM

## 2023-04-03 DIAGNOSIS — F17210 Nicotine dependence, cigarettes, uncomplicated: Secondary | ICD-10-CM | POA: Insufficient documentation

## 2023-04-03 DIAGNOSIS — I10 Essential (primary) hypertension: Secondary | ICD-10-CM | POA: Diagnosis not present

## 2023-04-03 HISTORY — PX: TRIGGER FINGER RELEASE: SHX641

## 2023-04-03 LAB — GLUCOSE, CAPILLARY
Glucose-Capillary: 117 mg/dL — ABNORMAL HIGH (ref 70–99)
Glucose-Capillary: 123 mg/dL — ABNORMAL HIGH (ref 70–99)

## 2023-04-03 SURGERY — RELEASE, A1 PULLEY, FOR TRIGGER FINGER
Anesthesia: Monitor Anesthesia Care | Site: Thumb | Laterality: Right

## 2023-04-03 MED ORDER — CEFAZOLIN SODIUM-DEXTROSE 2-4 GM/100ML-% IV SOLN
INTRAVENOUS | Status: AC
  Filled 2023-04-03: qty 100

## 2023-04-03 MED ORDER — HYDROMORPHONE HCL 1 MG/ML IJ SOLN: 0.2500 mg | INTRAMUSCULAR | Status: DC | PRN

## 2023-04-03 MED ORDER — ONDANSETRON HCL 4 MG/2ML IJ SOLN
INTRAMUSCULAR | Status: AC
  Filled 2023-04-03: qty 2

## 2023-04-03 MED ORDER — BUPIVACAINE HCL (PF) 0.25 % IJ SOLN
INTRAMUSCULAR | Status: DC | PRN
  Administered 2023-04-03: 10 mL

## 2023-04-03 MED ORDER — FENTANYL CITRATE (PF) 100 MCG/2ML IJ SOLN
INTRAMUSCULAR | Status: AC
  Filled 2023-04-03: qty 2

## 2023-04-03 MED ORDER — ONDANSETRON HCL 4 MG/2ML IJ SOLN
INTRAMUSCULAR | Status: DC | PRN
  Administered 2023-04-03: 4 mg via INTRAVENOUS

## 2023-04-03 MED ORDER — MIDAZOLAM HCL 2 MG/2ML IJ SOLN
INTRAMUSCULAR | Status: AC
Start: 2023-04-03 — End: ?
  Filled 2023-04-03: qty 2

## 2023-04-03 MED ORDER — OXYCODONE HCL 5 MG PO TABS: 5.0000 mg | ORAL_TABLET | Freq: Once | ORAL | Status: DC | PRN

## 2023-04-03 MED ORDER — LACTATED RINGERS IV SOLN: INTRAVENOUS | Status: DC

## 2023-04-03 MED ORDER — OXYCODONE HCL 5 MG/5ML PO SOLN: 5.0000 mg | Freq: Once | ORAL | Status: DC | PRN

## 2023-04-03 MED ORDER — SODIUM CHLORIDE 0.9 % IV SOLN: 12.5000 mg | INTRAVENOUS | Status: DC | PRN

## 2023-04-03 MED ORDER — PROPOFOL 500 MG/50ML IV EMUL
INTRAVENOUS | Status: DC | PRN
  Administered 2023-04-03: 100 ug/kg/min via INTRAVENOUS

## 2023-04-03 MED ORDER — 0.9 % SODIUM CHLORIDE (POUR BTL) OPTIME
TOPICAL | Status: DC | PRN
  Administered 2023-04-03: 200 mL

## 2023-04-03 MED ORDER — LACTATED RINGERS IV SOLN: INTRAVENOUS | Status: DC | PRN

## 2023-04-03 MED ORDER — CEFAZOLIN SODIUM-DEXTROSE 2-4 GM/100ML-% IV SOLN: 2.0000 g | INTRAVENOUS | Status: DC

## 2023-04-03 MED ORDER — FENTANYL CITRATE (PF) 100 MCG/2ML IJ SOLN
INTRAMUSCULAR | Status: DC | PRN
  Administered 2023-04-03: 50 ug via INTRAVENOUS

## 2023-04-03 MED ORDER — MIDAZOLAM HCL 5 MG/5ML IJ SOLN
INTRAMUSCULAR | Status: DC | PRN
  Administered 2023-04-03: 2 mg via INTRAVENOUS

## 2023-04-03 SURGICAL SUPPLY — 36 items
BAND RUBBER #18 3X1/16 STRL (MISCELLANEOUS) ×2 IMPLANT
BLADE SURG 15 STRL LF DISP TIS (BLADE) ×1 IMPLANT
BNDG ELASTIC 3INX 5YD STR LF (GAUZE/BANDAGES/DRESSINGS) ×1 IMPLANT
BNDG ESMARK 4X9 LF (GAUZE/BANDAGES/DRESSINGS) IMPLANT
BRUSH SCRUB EZ PLAIN DRY (MISCELLANEOUS) ×1 IMPLANT
CANISTER SUCT 1200ML W/VALVE (MISCELLANEOUS) ×1 IMPLANT
CORD BIPOLAR FORCEPS 12FT (ELECTRODE) ×1 IMPLANT
COVER BACK TABLE 60X90IN (DRAPES) ×1 IMPLANT
COVER MAYO STAND STRL (DRAPES) ×1 IMPLANT
CUFF TOURN SGL QUICK 18X4 (TOURNIQUET CUFF) ×1 IMPLANT
DRAPE EXTREMITY T 121X128X90 (DISPOSABLE) ×1 IMPLANT
DRAPE SURG 17X23 STRL (DRAPES) ×1 IMPLANT
GAUZE SPONGE 4X4 12PLY STRL (GAUZE/BANDAGES/DRESSINGS) ×1 IMPLANT
GAUZE XEROFORM 1X8 LF (GAUZE/BANDAGES/DRESSINGS) ×1 IMPLANT
GLOVE BIOGEL PI IND STRL 7.5 (GLOVE) ×1 IMPLANT
GLOVE ECLIPSE 7.0 STRL STRAW (GLOVE) ×1 IMPLANT
GLOVE INDICATOR 7.0 STRL GRN (GLOVE) ×1 IMPLANT
GLOVE SURG SYN 7.5 E (GLOVE) ×1
GLOVE SURG SYN 7.5 PF PI (GLOVE) ×1 IMPLANT
GOWN STRL REUS W/ TWL XL LVL3 (GOWN DISPOSABLE) ×1 IMPLANT
GOWN STRL SURGICAL XL XLNG (GOWN DISPOSABLE) ×1 IMPLANT
NDL HYPO 25X1 1.5 SAFETY (NEEDLE) ×1 IMPLANT
NEEDLE HYPO 25X1 1.5 SAFETY (NEEDLE) ×1
NS IRRIG 1000ML POUR BTL (IV SOLUTION) ×1 IMPLANT
PACK BASIN DAY SURGERY FS (CUSTOM PROCEDURE TRAY) ×1 IMPLANT
PAD CAST 3X4 CTTN HI CHSV (CAST SUPPLIES) ×1 IMPLANT
SHEET MEDIUM DRAPE 40X70 STRL (DRAPES) ×1 IMPLANT
SPIKE FLUID TRANSFER (MISCELLANEOUS) IMPLANT
STOCKINETTE 4X48 STRL (DRAPES) ×1 IMPLANT
SUT ETHILON 4 0 PS 2 18 (SUTURE) ×1 IMPLANT
SYR BULB EAR ULCER 3OZ GRN STR (SYRINGE) ×1 IMPLANT
SYR CONTROL 10ML LL (SYRINGE) ×1 IMPLANT
TOWEL GREEN STERILE FF (TOWEL DISPOSABLE) ×1 IMPLANT
TRAY DSU PREP LF (CUSTOM PROCEDURE TRAY) ×1 IMPLANT
TUBE CONNECTING 20X1/4 (TUBING) ×1 IMPLANT
UNDERPAD 30X36 HEAVY ABSORB (UNDERPADS AND DIAPERS) ×1 IMPLANT

## 2023-04-03 NOTE — H&P (Signed)
PREOPERATIVE H&P  Chief Complaint: right trigger thumb  HPI: Matthew Mcfarland is a 42 y.o. male who presents for surgical treatment of right trigger thumb.  He denies any changes in medical history.  Past Surgical History:  Procedure Laterality Date   NO PAST SURGERIES     Social History   Socioeconomic History   Marital status: Single    Spouse name: Not on file   Number of children: Not on file   Years of education: Not on file   Highest education level: 12th grade  Occupational History   Not on file  Tobacco Use   Smoking status: Some Days    Current packs/day: 0.50    Types: Cigarettes   Smokeless tobacco: Never  Vaping Use   Vaping status: Never Used  Substance and Sexual Activity   Alcohol use: Not Currently    Comment: occ   Drug use: No   Sexual activity: Yes    Birth control/protection: Condom  Other Topics Concern   Not on file  Social History Narrative   Not on file   Social Determinants of Health   Financial Resource Strain: Patient Declined (03/25/2023)   Overall Financial Resource Strain (CARDIA)    Difficulty of Paying Living Expenses: Patient declined  Food Insecurity: Patient Declined (03/25/2023)   Hunger Vital Sign    Worried About Running Out of Food in the Last Year: Patient declined    Ran Out of Food in the Last Year: Patient declined  Transportation Needs: No Transportation Needs (03/25/2023)   PRAPARE - Administrator, Civil Service (Medical): No    Lack of Transportation (Non-Medical): No  Physical Activity: Insufficiently Active (03/25/2023)   Exercise Vital Sign    Days of Exercise per Week: 2 days    Minutes of Exercise per Session: 10 min  Stress: Stress Concern Present (03/25/2023)   [employer] of Occupational Health - Occupational Stress Questionnaire    Feeling of Stress : To some extent  Social Connections: Unknown (03/25/2023)   Social Connection and Isolation Panel [NHANES]    Frequency of  Communication with Friends and Family: Three times a week    Frequency of Social Gatherings with Friends and Family: Never    Attends Religious Services: Patient declined    Database administrator or Organizations: Patient declined    Attends Engineer, structural: Not on file    Marital Status: Married   Family History  Problem Relation Age of Onset   Diabetes Father    Seizures Father    No Known Allergies Prior to Admission medications   Medication Sig Start Date End Date Taking? Authorizing Provider  Accu-Chek Softclix Lancets lancets Use as instructed to check blood sugar 3 times daily before meals 03/27/23  Yes Newlin, Enobong, MD  atorvastatin (LIPITOR) 10 MG tablet Take 1 tablet (10 mg total) by mouth daily. In the evening to lower cholesterol 03/27/23  Yes Newlin, Odette Horns, MD  Blood Glucose Monitoring Suppl (ACCU-CHEK GUIDE) w/Device KIT Use as directed to check blood sugar 3 times daily. 03/27/23  Yes Hoy Register, MD  Blood Glucose Monitoring Suppl (CONTOUR NEXT MONITOR) w/Device KIT 1 strip by Does not apply route daily. 05/22/17  Yes Hairston, Oren Beckmann, FNP  cetirizine (ZYRTEC) 10 MG tablet Take 10 mg by mouth daily. 10/09/20  Yes [provider]  fluticasone (FLONASE) 50 MCG/ACT nasal spray Shake liquid and place 2 sprays into both nostrils daily. 12/22/22  Yes Bennie Pierini, FNP  glucose blood test strip Use as instructed 05/21/17  Yes Hairston, Mandesia R, FNP  glucose blood test strip Use as instructed to check blood sugar 3 times daily 03/27/23  Yes Hoy Register, MD  metFORMIN (GLUCOPHAGE-XR) 500 MG 24 hr tablet Take 2 tablets (1,000 mg total) by mouth daily with breakfast. 03/27/23  Yes Newlin, Enobong, MD  Semaglutide,0.25 or 0.5MG /DOS, (OZEMPIC, 0.25 OR 0.5 MG/DOSE,) 2 MG/3ML SOPN Inject 0.25 mg into the skin once a week for 4 weeks, then increase to 0.5mg  thereafter. 03/27/23  Yes Hoy Register, MD  ergocalciferol (DRISDOL) 1.25 MG (50000  UT) capsule Take 1 capsule (50,000 Units total) by mouth once a week. 03/28/23   Hoy Register, MD  Semaglutide,0.25 or 0.5MG /DOS, (OZEMPIC, 0.25 OR 0.5 MG/DOSE,) 2 MG/3ML SOPN Inject 0.5 mg into the skin once a week. 03/27/23   Hoy Register, MD     Positive ROS: All other systems have been reviewed and were otherwise negative with the exception of those mentioned in the HPI and as above.  Physical Exam: General: Alert, no acute distress Cardiovascular: No pedal edema Respiratory: No cyanosis, no use of accessory musculature GI: abdomen soft Skin: No lesions in the area of chief complaint Neurologic: Sensation intact distally Psychiatric: Patient is competent for consent with normal mood and affect Lymphatic: no lymphedema  MUSCULOSKELETAL: exam stable  Assessment: right trigger thumb  Plan: Plan for Procedure(s): RIGHT TRIGGER THUMB RELEASE  The risks benefits and alternatives were discussed with the patient including but not limited to the risks of nonoperative treatment, versus surgical intervention including infection, bleeding, nerve injury,  blood clots, cardiopulmonary complications, morbidity, mortality, among others, and they were willing to proceed.   Glee Arvin, MD 04/03/2023 10:41 AM

## 2023-04-03 NOTE — Anesthesia Postprocedure Evaluation (Signed)
Anesthesia Post Note  Patient: Shohei Foglia  Procedure(s) Performed: RIGHT TRIGGER THUMB RELEASE (Right: Thumb)     Patient location during evaluation: PACU Anesthesia Type: MAC Level of consciousness: awake and alert Pain management: pain level controlled Vital Signs Assessment: post-procedure vital signs reviewed and stable Respiratory status: spontaneous breathing, nonlabored ventilation and respiratory function stable Cardiovascular status: blood pressure returned to baseline and stable Postop Assessment: no apparent nausea or vomiting Anesthetic complications: no   No notable events documented.  Last Vitals:  Vitals:   04/03/23 1245 04/03/23 1303  BP:  129/85  Pulse:  83  Resp:  16  Temp: (!) 36.3 C (!) 36.3 C  SpO2:  96%    Last Pain:  Vitals:   04/03/23 1303  TempSrc:   PainSc: 0-No pain                 Lowella Curb

## 2023-04-03 NOTE — Op Note (Signed)
   Date of Surgery: 04/03/2023  INDICATIONS: Matthew Mcfarland is a 42 y.o.-year-old male who presents for surgical treatment of stenosing tenosynovitis of right trigger thumb ;  The patient did consent to the procedure after discussion of the risks and benefits.  PREOPERATIVE DIAGNOSIS: Stenosing tenosynovitis right thumb  POSTOPERATIVE DIAGNOSIS: Same.  PROCEDURE: Trigger finger release of right thumb  SURGEON: Bettey Muraoka Glee Arvin, M.D.  ASSIST: None  ANESTHESIA:  local and MAC  IV FLUIDS AND URINE: See anesthesia.  ESTIMATED BLOOD LOSS: minimal mL  COMPLICATIONS: None.  DESCRIPTION OF PROCEDURE: The patient was identified in the preoperative holding area. The operative site was marked by the surgeon confirmed with the patient. He is brought back to the operating room. The patient was placed supine on table. A nonsterile tourniquet was placed on the arm. Local anesthetic was placed in the planned operative site. The operative extremity was prepped and draped in standard sterile fashion. Timeout was performed. Antibiotics were given. Timeout was performed.  Tourniquet was inflated to 250 mmHg.  A transverse incision was made in the flexion palmar flexion crease of the thumb overlying the MP joint. Blunt dissection was taken down to the level of the flexor tendon. The neurovascular bundles were identified and swept aside.  The proximal edge of the A1 pulley was identified. This was sharply incised.  The oblique pulley was released.  The proximal half of the A2 pulley was incised. The palmar pulley was then visualized and released also. The tourniquet was then deflated and hemostasis was obtained.  The FPL tendon was unremarkable.  The wound was thoroughly irrigated and closed with 4-0 nylon sutures. Sterile dressings were applied and the hand was placed in a soft dressing. Patient tolerated the procedure well and was taken to the PACU in stable condition.  POSTOPERATIVE PLAN: Patient will be weight  bearing as tolerated and to avoid heavy lifting for 4 weeks.    Matthew Reel, MD 12:25 PM

## 2023-04-03 NOTE — Anesthesia Preprocedure Evaluation (Addendum)
Anesthesia Evaluation  Patient identified by MRN, date of birth, ID band Patient awake    Reviewed: Allergy & Precautions, NPO status , Patient's Chart, lab work & pertinent test results, reviewed documented beta blocker date and time   History of Anesthesia Complications Negative for: history of anesthetic complications  Airway Mallampati: II  TM Distance: <3 FB Neck ROM: Full    Dental no notable dental hx.    Pulmonary asthma , Current Smoker and Patient abstained from smoking.   Pulmonary exam normal breath sounds clear to auscultation       Cardiovascular Exercise Tolerance: Good hypertension, Normal cardiovascular exam Rhythm:Regular Rate:Normal     Neuro/Psych   Anxiety     negative neurological ROS     GI/Hepatic negative GI ROS, Neg liver ROS,,,  Endo/Other  diabetes, Poorly Controlled, Type 2    Renal/GU negative Renal ROS Bladder dysfunction: smoker; smokes couple times a week.   negative genitourinary   Musculoskeletal negative musculoskeletal ROS (+)    Abdominal  (+) + obese Abdomen: soft. Bowel sounds: normal.  Peds negative pediatric ROS (+)  Hematology negative hematology ROS (+)   Anesthesia Other Findings Smoker, smokes a couple times a week Takes Ozempic/stopped 1 week ago Took metformin last night High cholesterol  Reproductive/Obstetrics negative OB ROS                             Anesthesia Physical Anesthesia Plan  ASA: 3  Anesthesia Plan: MAC   Post-op Pain Management: Minimal or no pain anticipated   Induction: Intravenous  PONV Risk Score and Plan: 0 and Treatment may vary due to age or medical condition  Airway Management Planned: Simple Face Mask  Additional Equipment:   Intra-op Plan:   Post-operative Plan:   Informed Consent:   Plan Discussed with:   Anesthesia Plan Comments:        Anesthesia Quick Evaluation

## 2023-04-03 NOTE — Transfer of Care (Signed)
Immediate Anesthesia Transfer of Care Note  Patient: Matthew Mcfarland  Procedure(s) Performed: RIGHT TRIGGER THUMB RELEASE (Right: Thumb)  Patient Location: PACU  Anesthesia Type:MAC  Level of Consciousness: awake, alert , and patient cooperative  Airway & Oxygen Therapy: Patient Spontanous Breathing and Patient connected to face mask oxygen  Post-op Assessment: Report given to RN and Post -op Vital signs reviewed and stable  Post vital signs: Reviewed and stable  Last Vitals:  Vitals Value Taken Time  BP    Temp    Pulse 99 04/03/23 1226  Resp    SpO2 97 % 04/03/23 1226    Last Pain:  Vitals:   04/03/23 1052  TempSrc: Oral  PainSc: 0-No pain         Complications: No notable events documented.

## 2023-04-03 NOTE — Discharge Instructions (Addendum)
Postoperative instructions:  Weightbearing instructions: no heavy lifting  Dressing instructions: Keep your dressing and/or splint clean and dry at all times.  It will be removed at your first post-operative appointment.  Your stitches and/or staples will be removed at this visit.  Incision instructions:  Do not soak your incision for 3 weeks after surgery.  If the incision gets wet, pat dry and do not scrub the incision.  Pain control:  You have been given a prescription to be taken as directed for post-operative pain control.  In addition, elevate the operative extremity above the heart at all times to prevent swelling and throbbing pain.  Take over-the-counter Colace, 100mg  by mouth twice a day while taking narcotic pain medications to help prevent constipation.  Follow up appointments: 1) 7-10 days for suture removal and wound check. 2) Dr. Roda Shutters as scheduled.   -------------------------------------------------------------------------------------------------------------  After Surgery Pain Control:  After your surgery, post-surgical discomfort or pain is likely. This discomfort can last several days to a few weeks. At certain times of the day your discomfort may be more intense.  Did you receive a nerve block?  A nerve block can provide pain relief for one hour to two days after your surgery. As long as the nerve block is working, you will experience little or no sensation in the area the surgeon operated on.  As the nerve block wears off, you will begin to experience pain or discomfort. It is very important that you begin taking your prescribed pain medication before the nerve block fully wears off. Treating your pain at the first sign of the block wearing off will ensure your pain is better controlled and more tolerable when full-sensation returns. Do not wait until the pain is intolerable, as the medicine will be less effective. It is better to treat pain in advance than to try and  catch up.  General Anesthesia:  If you did not receive a nerve block during your surgery, you will need to start taking your pain medication shortly after your surgery and should continue to do so as prescribed by your surgeon.  Pain Medication:  Most commonly we prescribe Vicodin and Percocet for post-operative pain. Both of these medications contain a combination of acetaminophen (Tylenol) and a narcotic to help control pain.   It takes between 30 and 45 minutes before pain medication starts to work. It is important to take your medication before your pain level gets too intense.   Nausea is a common side effect of many pain medications. You will want to eat something before taking your pain medicine to help prevent nausea.   If you are taking a prescription pain medication that contains acetaminophen, we recommend that you do not take additional over the counter acetaminophen (Tylenol).  Other pain relieving options:   Using a cold pack to ice the affected area a few times a day (15 to 20 minutes at a time) can help to relieve pain, reduce swelling and bruising.   Elevation of the affected area can also help to reduce pain and swelling.  Per [facility] clinic policy, our goal is ensure optimal postoperative pain control with a multimodal pain management strategy. For all [facility] patients, our goal is to wean post-operative narcotic medications by 6 weeks post-operatively. If this is not possible due to utilization of pain medication prior to surgery, your [facility] doctor will support your acute post-operative pain control for the first 6 weeks postoperatively, with a plan to transition you back to your  primary pain team following that. Cyndia Skeeters will work to ensure a Therapist, occupational.       Post Anesthesia Home Care Instructions  Activity: Get plenty of rest for the remainder of the day. A responsible individual must stay with you for 24 hours following the procedure.  For the next  24 hours, DO NOT: -Drive a car -Advertising copywriter -Drink alcoholic beverages -Take any medication unless instructed by your physician -Make any legal decisions or sign important papers.  Meals: Start with liquid foods such as gelatin or soup. Progress to regular foods as tolerated. Avoid greasy, spicy, heavy foods. If nausea and/or vomiting occur, drink only clear liquids until the nausea and/or vomiting subsides. Call your physician if vomiting continues.  Special Instructions/Symptoms: Your throat may feel dry or sore from the anesthesia or the breathing tube placed in your throat during surgery. If this causes discomfort, gargle with warm salt water. The discomfort should disappear within 24 hours.  If you had a scopolamine patch placed behind your ear for the management of post- operative nausea and/or vomiting:  1. The medication in the patch is effective for 72 hours, after which it should be removed.  Wrap patch in a tissue and discard in the trash. Wash hands thoroughly with soap and water. 2. You may remove the patch earlier than 72 hours if you experience unpleasant side effects which may include dry mouth, dizziness or visual disturbances. 3. Avoid touching the patch. Wash your hands with soap and water after contact with the patch.

## 2023-04-08 ENCOUNTER — Other Ambulatory Visit (HOSPITAL_COMMUNITY): Payer: Self-pay

## 2023-04-08 ENCOUNTER — Encounter (HOSPITAL_BASED_OUTPATIENT_CLINIC_OR_DEPARTMENT_OTHER): Payer: Self-pay | Admitting: Orthopaedic Surgery

## 2023-04-12 ENCOUNTER — Ambulatory Visit (INDEPENDENT_AMBULATORY_CARE_PROVIDER_SITE_OTHER): Payer: 59 | Admitting: Orthopaedic Surgery

## 2023-04-12 ENCOUNTER — Encounter: Payer: Self-pay | Admitting: Orthopaedic Surgery

## 2023-04-12 DIAGNOSIS — M65311 Trigger thumb, right thumb: Secondary | ICD-10-CM

## 2023-04-12 LAB — TSH: TSH: 1.42 u[IU]/mL (ref 0.450–4.500)

## 2023-04-12 LAB — CBC WITH DIFFERENTIAL/PLATELET
Basophils Absolute: 0.1 10*3/uL (ref 0.0–0.2)
Basos: 1 %
EOS (ABSOLUTE): 0.3 10*3/uL (ref 0.0–0.4)
Eos: 4 %
Hematocrit: 44.3 % (ref 37.5–51.0)
Hemoglobin: 14.7 g/dL (ref 13.0–17.7)
Immature Grans (Abs): 0 10*3/uL (ref 0.0–0.1)
Immature Granulocytes: 0 %
Lymphocytes Absolute: 2.8 10*3/uL (ref 0.7–3.1)
Lymphs: 32 %
MCH: 27.8 pg (ref 26.6–33.0)
MCHC: 33.2 g/dL (ref 31.5–35.7)
MCV: 84 fL (ref 79–97)
Monocytes Absolute: 0.6 10*3/uL (ref 0.1–0.9)
Monocytes: 6 %
Neutrophils Absolute: 5.1 10*3/uL (ref 1.4–7.0)
Neutrophils: 57 %
Platelets: 304 10*3/uL (ref 150–450)
RBC: 5.28 x10E6/uL (ref 4.14–5.80)
RDW: 11.9 % (ref 11.6–15.4)
WBC: 8.9 10*3/uL (ref 3.4–10.8)

## 2023-04-12 LAB — CMP14+EGFR
ALT: 50 [IU]/L — ABNORMAL HIGH (ref 0–44)
AST: 25 [IU]/L (ref 0–40)
Albumin: 4.4 g/dL (ref 4.1–5.1)
Alkaline Phosphatase: 67 [IU]/L (ref 44–121)
BUN/Creatinine Ratio: 13 (ref 9–20)
BUN: 12 mg/dL (ref 6–24)
Bilirubin Total: 0.2 mg/dL (ref 0.0–1.2)
CO2: 22 mmol/L (ref 20–29)
Calcium: 9.8 mg/dL (ref 8.7–10.2)
Chloride: 101 mmol/L (ref 96–106)
Creatinine, Ser: 0.95 mg/dL (ref 0.76–1.27)
Globulin, Total: 3.3 g/dL (ref 1.5–4.5)
Glucose: 185 mg/dL — ABNORMAL HIGH (ref 70–99)
Potassium: 4.5 mmol/L (ref 3.5–5.2)
Sodium: 136 mmol/L (ref 134–144)
Total Protein: 7.7 g/dL (ref 6.0–8.5)
eGFR: 104 mL/min/{1.73_m2} (ref >59)

## 2023-04-12 LAB — HCV AB W REFLEX TO QUANT PCR: HCV Ab: NONREACTIVE

## 2023-04-12 LAB — LP+NON-HDL CHOLESTEROL
Cholesterol, Total: 178 mg/dL (ref 100–199)
HDL: 44 mg/dL (ref >39)
LDL Chol Calc (NIH): 125 mg/dL — ABNORMAL HIGH (ref 0–99)
Total Non-HDL-Chol (LDL+VLDL): 134 mg/dL — ABNORMAL HIGH (ref 0–129)
Triglycerides: 48 mg/dL (ref 0–149)
VLDL Cholesterol Cal: 9 mg/dL (ref 5–40)

## 2023-04-12 LAB — HCV INTERPRETATION

## 2023-04-12 LAB — T4, FREE: Free T4: 1.16 ng/dL (ref 0.82–1.77)

## 2023-04-12 LAB — HIV ANTIBODY (ROUTINE TESTING W REFLEX): HIV Screen 4th Generation wRfx: NONREACTIVE

## 2023-04-12 LAB — TESTOSTERONE, FREE AND TOTAL (INCLUDES SHBG)-(MALES)
% Free Testosterone: 2.3 %
Free Testosterone, S: 83 pg/mL
Sex Hormone Binding Globulin: 40.7 nmol/L
Testosterone, Serum (Total): 362 ng/dL

## 2023-04-12 LAB — VITAMIN D 25 HYDROXY (VIT D DEFICIENCY, FRACTURES): Vit D, 25-Hydroxy: 10.5 ng/mL — ABNORMAL LOW (ref 30.0–100.0)

## 2023-04-12 NOTE — Progress Notes (Signed)
   Post-Op Visit Note   Patient: Matthew Mcfarland           Date of Birth: 01-12-1981           MRN: [id number] Visit Date: 04/12/2023 PCP: Cain Saupe, MD   Assessment & Plan:  Chief Complaint:  Chief Complaint  Patient presents with   Right Hand - Follow-up    Right trigger thumb release 04/03/2023   Visit Diagnoses:  1. Trigger thumb, right thumb     Plan: [name] is approximately 9 days status post right trigger thumb release.  He is doing well overall.  He has a little bit of numbness and tingling and burning.  He states that the thumb feels much better.  Exam of the right thumb shows healed surgical incision.  There is no drainage or infection.  Neurovascular intact.  Sutures removed Steri-Strips applied.  Increase activity as tolerated.  Recheck in 3 weeks.  He is currently out of work.  Follow-Up Instructions: Return in about 3 weeks (around 05/03/2023).   Orders:  No orders of the defined types were placed in this encounter.  No orders of the defined types were placed in this encounter.   Imaging: No results found.  PMFS History: Patient Active Problem List   Diagnosis Date Noted   Trigger thumb, right thumb 04/03/2023   Type 2 diabetes mellitus (HCC) 03/27/2023   Asthma 03/27/2023   Anxiety 03/27/2023   History of ETOH abuse 05/21/2017   Past Medical History:  Diagnosis Date   Anxiety    Asthma    Type 2 diabetes mellitus (HCC)     Family History  Problem Relation Age of Onset   Diabetes Father    Seizures Father     Past Surgical History:  Procedure Laterality Date   NO PAST SURGERIES     TRIGGER FINGER RELEASE Right 04/03/2023   Procedure: RIGHT TRIGGER THUMB RELEASE;  Surgeon: Tarry Kos, MD;  Location: [facility];  Service: Orthopedics;  Laterality: Right;   Social History   Occupational History   Not on file  Tobacco Use   Smoking status: Some Days    Current packs/day: 0.50    Types: Cigarettes   Smokeless tobacco:  Never  Vaping Use   Vaping status: Never Used  Substance and Sexual Activity   Alcohol use: Not Currently    Comment: occ   Drug use: No   Sexual activity: Yes    Birth control/protection: Condom

## 2023-04-15 ENCOUNTER — Other Ambulatory Visit (HOSPITAL_COMMUNITY): Payer: Self-pay

## 2023-04-19 ENCOUNTER — Telehealth: Payer: Self-pay

## 2023-04-19 NOTE — Telephone Encounter (Signed)
Tried to return call. No answer. Went to [employer]. Ask him to call back.

## 2023-04-19 NOTE — Telephone Encounter (Signed)
Pt called and lm on vm to ask if someone can call him back about an extension of his FMLA paperwork. Cb#[phone]

## 2023-04-25 ENCOUNTER — Telehealth: Payer: Self-pay | Admitting: Orthopaedic Surgery

## 2023-04-25 NOTE — Telephone Encounter (Signed)
Patient called and need a extention on when to go back to work.Marland Kitchen CB#[phone]

## 2023-04-25 NOTE — Telephone Encounter (Signed)
Tried to call patient to clarify what she is needing.

## 2023-04-26 ENCOUNTER — Other Ambulatory Visit: Payer: Self-pay

## 2023-04-26 ENCOUNTER — Encounter: Payer: Self-pay | Admitting: Orthopaedic Surgery

## 2023-04-26 ENCOUNTER — Telehealth: Payer: Self-pay | Admitting: Orthopaedic Surgery

## 2023-04-26 NOTE — Telephone Encounter (Signed)
Patient called asked if he is clear to go back to work without restrictions? Patient said his post op visit is 05/03/2023 at 10:00. Patient said his FMLA is suppose to end on 05/01/2023.   Patient asked if he can have an extension until he is cleared to return full duty? The number to contact patient is [phone]

## 2023-04-29 ENCOUNTER — Other Ambulatory Visit: Payer: Self-pay

## 2023-04-29 ENCOUNTER — Encounter: Payer: Self-pay | Admitting: Pharmacist

## 2023-04-30 ENCOUNTER — Other Ambulatory Visit (HOSPITAL_COMMUNITY): Payer: Self-pay

## 2023-05-03 ENCOUNTER — Telehealth: Payer: Self-pay

## 2023-05-03 ENCOUNTER — Encounter: Payer: 59 | Admitting: Orthopaedic Surgery

## 2023-05-03 NOTE — Telephone Encounter (Signed)
Form updated and faxed with work note [phone]

## 2023-05-03 NOTE — Telephone Encounter (Signed)
Patient's appointment has been rescheduled since Dr.Xu was out of the office today. We have created him a new note for [employer] that he will be out of work until his follow up on May 10, 2023. Please relay to [employer] per patient. Thanks!

## 2023-05-10 ENCOUNTER — Encounter: Payer: Self-pay | Admitting: Physician Assistant

## 2023-05-10 ENCOUNTER — Ambulatory Visit (INDEPENDENT_AMBULATORY_CARE_PROVIDER_SITE_OTHER): Payer: 59 | Admitting: Physician Assistant

## 2023-05-10 DIAGNOSIS — M65311 Trigger thumb, right thumb: Secondary | ICD-10-CM

## 2023-05-10 NOTE — Progress Notes (Signed)
   Post-Op Visit Note   Patient: Matthew Mcfarland           Date of Birth: 06/20/1980           MRN: [id number] Visit Date: 05/10/2023 PCP: Alec House, MD   Assessment & Plan:  Chief Complaint:  Chief Complaint  Patient presents with   Right Hand - Follow-up    Right thumb trigger finger release 04/03/2023   Visit Diagnoses:  1. Trigger thumb, right thumb     Plan: Patient is a pleasant 43 year old gentleman who comes in today 5 weeks status post right trigger thumb release 04/03/2023.  He complains of a little discomfort but nothing more.  No paresthesias and no triggering.  Examination of the right thumb reveals a fully healed surgical scar without complication.  He is neurovascularly intact distally.  At this point, he will advance with activity as tolerated.  He may return to work 05/16/2023 without restrictions.  Note provided today.  Call with concerns or questions.  Follow-Up Instructions: Return if symptoms worsen or fail to improve.   Orders:  No orders of the defined types were placed in this encounter.  No orders of the defined types were placed in this encounter.   Imaging: No new imaging  PMFS History: Patient Active Problem List   Diagnosis Date Noted   Trigger thumb, right thumb 04/03/2023   Type 2 diabetes mellitus (HCC) 03/27/2023   Asthma 03/27/2023   Anxiety 03/27/2023   History of ETOH abuse 05/21/2017   Past Medical History:  Diagnosis Date   Anxiety    Asthma    Type 2 diabetes mellitus (HCC)     Family History  Problem Relation Age of Onset   Diabetes Father    Seizures Father     Past Surgical History:  Procedure Laterality Date   NO PAST SURGERIES     TRIGGER FINGER RELEASE Right 04/03/2023   Procedure: RIGHT TRIGGER THUMB RELEASE;  Surgeon: Jerri Kay HERO, MD;  Location: [facility];  Service: Orthopedics;  Laterality: Right;   Social History   Occupational History   Not on file  Tobacco Use   Smoking status: Some Days     Current packs/day: 0.50    Types: Cigarettes   Smokeless tobacco: Never  Vaping Use   Vaping status: Never Used  Substance and Sexual Activity   Alcohol use: Not Currently    Comment: occ   Drug use: No   Sexual activity: Yes    Birth control/protection: Condom

## 2023-05-14 ENCOUNTER — Other Ambulatory Visit (HOSPITAL_COMMUNITY): Payer: Self-pay

## 2023-05-27 ENCOUNTER — Other Ambulatory Visit (HOSPITAL_COMMUNITY): Payer: Self-pay

## 2023-05-27 ENCOUNTER — Encounter: Payer: Self-pay | Admitting: Family Medicine

## 2023-05-27 ENCOUNTER — Encounter (HOSPITAL_COMMUNITY): Payer: Self-pay

## 2023-05-27 ENCOUNTER — Other Ambulatory Visit: Payer: Self-pay | Admitting: Family Medicine

## 2023-05-27 ENCOUNTER — Encounter: Payer: Self-pay | Admitting: Orthopaedic Surgery

## 2023-05-27 DIAGNOSIS — Z7985 Long-term (current) use of injectable non-insulin antidiabetic drugs: Secondary | ICD-10-CM

## 2023-05-27 DIAGNOSIS — E1169 Type 2 diabetes mellitus with other specified complication: Secondary | ICD-10-CM

## 2023-06-05 ENCOUNTER — Ambulatory Visit: Payer: 59 | Admitting: Family Medicine

## 2023-06-27 ENCOUNTER — Ambulatory Visit: Payer: Self-pay | Admitting: Family Medicine

## 2023-06-27 ENCOUNTER — Other Ambulatory Visit: Payer: Self-pay | Admitting: Family Medicine

## 2023-06-27 DIAGNOSIS — E1169 Type 2 diabetes mellitus with other specified complication: Secondary | ICD-10-CM

## 2023-06-27 DIAGNOSIS — Z7985 Long-term (current) use of injectable non-insulin antidiabetic drugs: Secondary | ICD-10-CM

## 2023-06-27 NOTE — Telephone Encounter (Signed)
 Copied from CRM [id number]. Topic: Clinical - Medication Refill >> Jun 27, 2023  4:16 PM Dondra Prader A wrote: Most Recent Primary Care Visit:  Provider: Hoy Register  Department: [facility]  Visit Type: NEW PATIENT  Date: 03/27/2023  Medication: Semaglutide,0.25 or 0.5MG /DOS, (OZEMPIC, 0.25 OR 0.5 MG/DOSE,) 2 MG/3ML SOPN Patient states that he is on the .5mg   Has the patient contacted their pharmacy? Yes   Is this the correct pharmacy for this prescription? Yes If no, delete pharmacy and type the correct one.  This is the patient's preferred pharmacy:  [facility] [address] [place] [address] Phone: [phone] Fax: [phone]    Has the prescription been filled recently? No  Is the patient out of the medication? Yes Patient has been out of medication for two weeks. Patient was scheduled for and appointment today but was called due to the weather and his appointment was rescheduled to  April. Pt is wanting his medication called in to last him until his next appointment.   Has the patient been seen for an appointment in the last year OR does the patient have an upcoming appointment? Yes  Can we respond through MyChart? Yes  Agent: Please be advised that Rx refills may take up to 3 business days. We ask that you follow-up with your pharmacy.

## 2023-06-28 ENCOUNTER — Other Ambulatory Visit (HOSPITAL_COMMUNITY): Payer: Self-pay

## 2023-06-28 NOTE — Telephone Encounter (Signed)
 Requested medication (s) are due for refill today: yes  Requested medication (s) are on the active medication list: yes  Last refill:    Future visit scheduled: yes  Notes to clinic:  New patient, routing for approval     Requested Prescriptions  Pending Prescriptions Disp Refills   Semaglutide,0.25 or 0.5MG /DOS, (OZEMPIC, 0.25 OR 0.5 MG/DOSE,) 2 MG/3ML SOPN 3 mL 2    Sig: Inject 0.5 mg into the skin once a week.     Endocrinology:  Diabetes - GLP-1 Receptor Agonists - semaglutide Failed - 06/28/2023  1:50 PM      Failed - HBA1C in normal range and within 180 days    HbA1c, POC (prediabetic range)  Date Value Ref Range Status  04/16/2018 6.0 5.7 - 6.4 % Corrected   HbA1c, POC (controlled diabetic range)  Date Value Ref Range Status  03/27/2023 9.1 (A) 0.0 - 7.0 % Final         Passed - Cr in normal range and within 360 days    Creatinine, Ser  Date Value Ref Range Status  03/27/2023 0.95 0.76 - 1.27 mg/dL Final   Creatinine, POC  Date Value Ref Range Status  06/04/2017 10 mg/dL Final         Passed - Valid encounter within last 6 months    Recent Outpatient Visits           3 months ago Type 2 diabetes mellitus with other specified complication, without long-term current use of insulin (HCC)   [facility] [place]. [facility] Hoy Register, MD   5 years ago Type 2 diabetes mellitus without complication, without long-term current use of insulin (HCC)   [facility] [place]. [facility] [place], [place] II, MD   6 years ago Type 2 diabetes mellitus without complication, without long-term current use of insulin (HCC)   [facility] Dept Of [place]. [facility] [place], [place] R, FNP   6 years ago Newly diagnosed diabetes [facility])   [facility] Of [place]. [facility] [place], [place] Re[place] R, FNP       Future  Appointments             In 1 month Hoy Register, MD [facility] - [facility] Dept Of [place]. [facility]

## 2023-07-01 ENCOUNTER — Other Ambulatory Visit (HOSPITAL_COMMUNITY): Payer: Self-pay

## 2023-07-01 MED ORDER — OZEMPIC (0.25 OR 0.5 MG/DOSE) 2 MG/3ML ~~LOC~~ SOPN
0.5000 mg | PEN_INJECTOR | SUBCUTANEOUS | 2 refills | Status: DC
  Filled 2023-07-01 – 2023-07-24 (×3): qty 3, 28d supply, fill #0

## 2023-07-18 ENCOUNTER — Other Ambulatory Visit (HOSPITAL_COMMUNITY): Payer: Self-pay

## 2023-07-24 ENCOUNTER — Other Ambulatory Visit (HOSPITAL_COMMUNITY): Payer: Self-pay

## 2023-07-31 ENCOUNTER — Other Ambulatory Visit (HOSPITAL_COMMUNITY): Payer: Self-pay

## 2023-08-05 ENCOUNTER — Other Ambulatory Visit (HOSPITAL_COMMUNITY): Payer: Self-pay

## 2023-08-12 ENCOUNTER — Other Ambulatory Visit: Payer: Self-pay

## 2023-08-12 ENCOUNTER — Ambulatory Visit: Payer: Commercial Managed Care - PPO | Admitting: Family Medicine

## 2023-08-12 ENCOUNTER — Telehealth (HOSPITAL_BASED_OUTPATIENT_CLINIC_OR_DEPARTMENT_OTHER): Admitting: Family Medicine

## 2023-08-12 ENCOUNTER — Other Ambulatory Visit (HOSPITAL_COMMUNITY): Payer: Self-pay

## 2023-08-12 ENCOUNTER — Encounter: Payer: Self-pay | Admitting: Family Medicine

## 2023-08-12 DIAGNOSIS — Z7985 Long-term (current) use of injectable non-insulin antidiabetic drugs: Secondary | ICD-10-CM | POA: Diagnosis not present

## 2023-08-12 DIAGNOSIS — E119 Type 2 diabetes mellitus without complications: Secondary | ICD-10-CM | POA: Diagnosis not present

## 2023-08-12 DIAGNOSIS — Z7984 Long term (current) use of oral hypoglycemic drugs: Secondary | ICD-10-CM | POA: Diagnosis not present

## 2023-08-12 DIAGNOSIS — E785 Hyperlipidemia, unspecified: Secondary | ICD-10-CM | POA: Diagnosis not present

## 2023-08-12 DIAGNOSIS — E1169 Type 2 diabetes mellitus with other specified complication: Secondary | ICD-10-CM

## 2023-08-12 MED ORDER — SEMAGLUTIDE (1 MG/DOSE) 4 MG/3ML ~~LOC~~ SOPN
1.0000 mg | PEN_INJECTOR | SUBCUTANEOUS | 3 refills | Status: DC
  Filled 2023-08-12 (×2): qty 3, 28d supply, fill #0
  Filled 2023-09-05 – 2023-09-19 (×3): qty 3, 28d supply, fill #1
  Filled 2023-10-13: qty 3, 28d supply, fill #2

## 2023-08-12 NOTE — Patient Instructions (Signed)
 VISIT SUMMARY:  You came in today for a follow-up visit regarding your diabetes management. We discussed your recent blood glucose readings and your current medications. You also mentioned your interest in additional weight loss and the need for a refill of your glucose meter.  YOUR PLAN:  -TYPE 2 DIABETES MELLITUS: Type 2 Diabetes Mellitus is a condition where your body does not use insulin properly, leading to high blood sugar levels. We will increase your Ozempic dose to 1 mg to help with weight loss and better control your blood sugar levels. Continue taking metformin as prescribed. We will also order an A1c test and evaluate your kidney and liver function with blood tests. Your prescriptions for metformin, Ozempic 1 mg, and atorvastatin have been sent to the pharmacy.  -HYPERLIPIDEMIA: Hyperlipidemia is a condition where you have high levels of fats (lipids) in your blood. Your cholesterol levels were normal in November, so continue taking atorvastatin as prescribed.  INSTRUCTIONS:  Please schedule an appointment for your A1c test and blood tests to evaluate kidney and liver function. Continue monitoring your blood glucose levels at home and follow up with [place] if you have any concerns or notice any significant changes.

## 2023-08-12 NOTE — Progress Notes (Signed)
 Virtual Visit via Video Note  I connected with Matthew Mcfarland, on 08/12/2023 at 3:56 PM by video enabled telemedicine device and verified that I am speaking with the correct person using two identifiers.   Consent: I discussed the limitations, risks, security and privacy concerns of performing an evaluation and management service by telemedicine and the availability of in person appointments. I also discussed with the patient that there may be a patient responsible charge related to this service. The patient expressed understanding and agreed to proceed.   Location of Patient: Work  Government social research officer of Provider: Clinic   Persons participating in Telemedicine visit: Matthew Mcfarland Dr. Alvis Lemmings    Discussed the use of AI scribe software for clinical note transcription with the patient, who gave verbal consent to proceed.  History of Present Illness The patient, with a history of Nicotine dependence (2 Cigarettes/day x 63yrs), type 2 diabetes mellitus, anxiety, asthma. , presents for a follow-up visit. His last A1c in November was 9.1, which was above the target of less than 7. He reports that his home blood glucose readings have been between 128 and 142-150. He is currently on metformin and Ozempic 0.5mg , and he expresses a desire for additional weight loss. He also mentions needing to pick up his glucose meter, Freestyle Light, but declines the continuous glucose monitor due to concerns about it being dislodged at work. Denies presence of additional concerns today.     Past Medical History:  Diagnosis Date   Anxiety    Asthma    Type 2 diabetes mellitus (HCC)    No Known Allergies  Current Outpatient Medications on File Prior to Visit  Medication Sig Dispense Refill   Lancets (FREESTYLE) lancets Use as instructed to check blood sugar 3 times daily before meals 100 each 12   atorvastatin (LIPITOR) 10 MG tablet Take 1 tablet (10 mg total) by mouth daily. In the evening to lower cholesterol  90 tablet 1   Blood Glucose Monitoring Suppl (FREESTYLE LITE) w/Device KIT Use as directed to check blood sugar 3 (three) times daily. 1 kit 0   Blood Glucose Monitoring Suppl (CONTOUR NEXT MONITOR) w/Device KIT 1 strip by Does not apply route daily. 1 kit 0   cetirizine (ZYRTEC) 10 MG tablet Take 10 mg by mouth daily.     ergocalciferol (DRISDOL) 1.25 MG (50000 UT) capsule Take 1 capsule (50,000 Units total) by mouth once a week. 12 capsule 1   fluticasone (FLONASE) 50 MCG/ACT nasal spray Shake liquid and place 2 sprays into both nostrils daily. 16 g 6   glucose blood test strip Use as instructed 100 each 12   glucose blood test strip Use as instructed to check blood sugar 3 times daily 100 each 12   metFORMIN (GLUCOPHAGE-XR) 500 MG 24 hr tablet Take 2 tablets (1,000 mg total) by mouth daily with breakfast. 360 tablet 1   No current facility-administered medications on file prior to visit.    ROS: See HPI  Observations/Objective: Awake, alert, oriented x3 Not in acute distress Normal mood      Latest Ref Rng & Units 03/27/2023   10:01 AM 04/16/2018   12:20 PM 05/21/2017    3:35 PM  CMP  Glucose 70 - 99 mg/dL 295  284  132   BUN 6 - 24 mg/dL 12  9  13    Creatinine 0.76 - 1.27 mg/dL 4.40  1.02  7.25   Sodium 134 - 144 mmol/L 136  141  141   Potassium  3.5 - 5.2 mmol/L 4.5  4.4  4.2   Chloride 96 - 106 mmol/L 101  103  103   CO2 20 - 29 mmol/L 22  22  21    Calcium 8.7 - 10.2 mg/dL 9.8  9.4  9.6   Total Protein 6.0 - 8.5 g/dL 7.7  7.2  7.8   Total Bilirubin 0.0 - 1.2 mg/dL 0.2  0.2  0.3   Alkaline Phos 44 - 121 IU/L 67  56  77   AST 0 - 40 IU/L 25  17  26    ALT 0 - 44 IU/L 50  34  61     Lipid Panel     Component Value Date/Time   CHOL 178 03/27/2023 1001   TRIG 48 03/27/2023 1001   HDL 44 03/27/2023 1001   CHOLHDL 4.0 05/21/2017 1535   LDLCALC 125 (H) 03/27/2023 1001   LABVLDL 9 03/27/2023 1001    Lab Results  Component Value Date   HGBA1C 9.1 (A) 03/27/2023       Assessment & Plan Type 2 Diabetes Mellitus Last A1c was 9.1 Improved glycemic control. Current blood glucose 128-150 mg/dL. Considering Ozempic increase for weight loss and stable glucose levels. - Increase Ozempic to 1 mg. - Continue metformin. - Order A1c test. - Evaluate kidney and liver function with blood tests. - Send refills for metformin, Ozempic 1 mg, and atorvastatin to pharmacy. -Counseled on Diabetic diet, my plate method, 811 minutes of moderate intensity exercise/week Blood sugar logs with fasting goals of 80-120 mg/dl, random of less than 914 and in the event of sugars less than 60 mg/dl or greater than 782 mg/dl encouraged to notify the clinic. Advised on the need for annual eye exams, annual foot exams, Pneumonia vaccine.   Hyperlipidemia Managed with atorvastatin. Cholesterol levels normal in November. - Continue atorvastatin.      Meds ordered this encounter  Medications   Semaglutide, 1 MG/DOSE, 4 MG/3ML SOPN    Sig: Inject 1 mg as directed once a week.    Dispense:  3 mL    Refill:  3    Dose increase.    Follow Up Instructions: Return in about 3 months (around 11/11/2023) for Chronic medical conditions.    I discussed the assessment and treatment plan with the patient. The patient was provided an opportunity to ask questions and all were answered. The patient agreed with the plan and demonstrated an understanding of the instructions.   The patient was advised to call back or seek an in-person evaluation if the symptoms worsen or if the condition fails to improve as anticipated.     I provided 16 minutes total of Telehealth time during this encounter including median intraservice time, reviewing previous notes, investigations, ordering medications, medical decision making, coordinating care and patient verbalized understanding at the end of the visit.     Hoy Register, MD, FAAFP. [facility]  [place], [place] [phone]   08/12/2023, 3:56 PM

## 2023-08-14 ENCOUNTER — Other Ambulatory Visit (HOSPITAL_COMMUNITY): Payer: Self-pay

## 2023-08-14 ENCOUNTER — Other Ambulatory Visit: Payer: Self-pay

## 2023-08-14 ENCOUNTER — Ambulatory Visit: Attending: Family Medicine

## 2023-08-14 ENCOUNTER — Ambulatory Visit (HOSPITAL_BASED_OUTPATIENT_CLINIC_OR_DEPARTMENT_OTHER): Attending: Family Medicine | Admitting: Internal Medicine

## 2023-08-14 ENCOUNTER — Other Ambulatory Visit: Payer: Self-pay | Admitting: Family Medicine

## 2023-08-14 DIAGNOSIS — E1169 Type 2 diabetes mellitus with other specified complication: Secondary | ICD-10-CM | POA: Diagnosis not present

## 2023-08-14 DIAGNOSIS — G4730 Sleep apnea, unspecified: Secondary | ICD-10-CM | POA: Insufficient documentation

## 2023-08-14 DIAGNOSIS — R29818 Other symptoms and signs involving the nervous system: Secondary | ICD-10-CM

## 2023-08-14 MED ORDER — CETIRIZINE HCL 10 MG PO TABS
10.0000 mg | ORAL_TABLET | Freq: Every day | ORAL | 1 refills | Status: AC
  Filled 2023-08-14 – 2023-09-19 (×4): qty 100, 100d supply, fill #0

## 2023-08-15 ENCOUNTER — Encounter: Payer: Self-pay | Admitting: Internal Medicine

## 2023-08-15 LAB — CMP14+EGFR
ALT: 34 IU/L (ref 0–44)
AST: 24 IU/L (ref 0–40)
Albumin: 4.4 g/dL (ref 4.1–5.1)
Alkaline Phosphatase: 59 IU/L (ref 44–121)
BUN/Creatinine Ratio: 14 (ref 9–20)
BUN: 14 mg/dL (ref 6–24)
Bilirubin Total: 0.3 mg/dL (ref 0.0–1.2)
CO2: 23 mmol/L (ref 20–29)
Calcium: 9.8 mg/dL (ref 8.7–10.2)
Chloride: 101 mmol/L (ref 96–106)
Creatinine, Ser: 1.03 mg/dL (ref 0.76–1.27)
Globulin, Total: 2.8 g/dL (ref 1.5–4.5)
Glucose: 86 mg/dL (ref 70–99)
Potassium: 4.4 mmol/L (ref 3.5–5.2)
Sodium: 138 mmol/L (ref 134–144)
Total Protein: 7.2 g/dL (ref 6.0–8.5)
eGFR: 94 mL/min/{1.73_m2} (ref >59)

## 2023-08-15 LAB — HEMOGLOBIN A1C
Est. average glucose Bld gHb Est-mCnc: 137 mg/dL
Hgb A1c MFr Bld: 6.4 % — ABNORMAL HIGH (ref 4.8–5.6)

## 2023-08-26 ENCOUNTER — Other Ambulatory Visit (HOSPITAL_COMMUNITY): Payer: Self-pay

## 2023-08-27 ENCOUNTER — Other Ambulatory Visit (HOSPITAL_COMMUNITY): Payer: Self-pay

## 2023-08-30 ENCOUNTER — Encounter: Payer: Self-pay | Admitting: Family Medicine

## 2023-08-31 DIAGNOSIS — R29818 Other symptoms and signs involving the nervous system: Secondary | ICD-10-CM

## 2023-08-31 NOTE — Procedures (Signed)
 [facility] [address] [place], [place] [address] Tel: [phone]   Fax: [phone]  Home Sleep Test Interpretation  Patient Name: Matthew Mcfarland, Matthew Mcfarland Date: 08/17/2023  Date of Birth: Jul 29, 1980 Study Type: HST  Age: 43 year MRN #: [id number]  Sex: Male Interpreting Physician: Rosa College [id number]  Height: 5\' 7"  Referring Physician: Dr. Joaquin Mulberry  Weight: 206.0 lbs Recording Tech: Alvah Auerbach RRT RPSGT RST  BMI: 32.0 Scoring Tech: Holly Neeriemer RPSGT RST  ESS: 5 Neck Size: 18.5   Indications for Polysomnography The patient is a 43 year-old Male who is 5\' 7"  and weighs 206.0 lbs. His BMI equals 32.0.  A home sleep apnea test was performed to evaluate for -.fatigue  Medication  No Data.   Polysomnogram Data A home sleep test recorded the standard physiologic parameters including EKG, nasal and oral airflow.  Respiratory parameters of chest and abdominal movements were recorded with Respiratory Inductance Plethysmography belts.  Oxygen saturation was recorded by pulse oximetry.   Study Architecture The total recording time of the polysomnogram was 373.5 minutes.  The total monitoring time was 374.0 minutes.  Time spent in Supine position was 36.0 minutes.   Respiratory Events The study revealed a presence of 8 obstructive, - central, and - mixed apneas resulting in an Apnea index of 1.3 events per hour.  There were 18 hypopneas (>=3% desaturation and/or arousal) resulting in an Apnea\Hypopnea Index (AHI >=3% desaturation and/or arousal) of 4.2 events per hour.  There were 6 hypopneas (>=4% desaturation) resulting in an Apnea\Hypopnea Index (AHI >=4% desaturation) of 2.2 events per hour.  There were - Respiratory Effort Related Arousals resulting in a RERA index of - events per hour. The Respiratory Disturbance Index is 4.2 events per hour.  The snore index was 8.5 events per hour.  Mean oxygen saturation was 98.0%.  The  lowest oxygen saturation during monitoring time was 92.0%.  Time spent <=88% oxygen saturation was - minutes (-).  Cardiac Summary The average pulse rate was 79.8 bpm.  The minimum pulse rate was 57.0 bpm while the maximum pulse rate was 110.0 bpm.  Cardiac rhythm was normal/abnormal.  Comment: Occasional apneas and hypopneas, within normal limits, AHI (3%) 4.2/hr. Snoring with Oxygen desaturation to a nadir of 92%.  Diagnosis: Normal study  Recommendations: None   This study was personally reviewed and electronically signed by: Dr. Faustina Hood Accredited Board Certified in Sleep Medicine Date/Time: 08/31/23 1:50   Study Overview  Recording Time: 678.9 min. Monitoring Time: 374.0 min.  Analysis Start:  12:10:44 AM Supine Time: 36.0 min.  Analysis Stop:  06:24:14 AM     Study Summary   Count Index Longest Event Duration  Apneas & Hypopneas: 26 4.2  Apneas: 25.6 sec.     Hypopneas: 69.6 sec.  RERAs: - - - sec.  Desaturations: 24 3.9 113.0 sec.  Snores: 53 8.5 87.2 sec.    Minimum Oxygen Saturation: 92.0%    Respiratory Summary   Total Duration Supine Non-Supine   Count Index Average Longest Count Index Count Index  Obstructive Apnea 8 1.3 21.0 25.6 8 13.3 - -   Mixed Apnea - - - - - - - -   Central Apnea - - - - - - - -   Total Apneas 8 1.3 21.0 25.6 8 13.3 - -            Hypopneas 3% 18 2.9 N.A. N.A. 11 18.3 7 1.2  Apneas & Hyp. 3% 26 4.2 N.A. N.A. 19 31.7 7 1.2            Hypopneas 4% 6 1.0 N.A. N.A. 4 6.7 2 0.4  Apneas & Hyp. 4% 14 2.2 N.A. N.A. 12 20.0 2 0.4             RERAs - - - - - - - -  RDI 27 4.3 N.A. N.A. 19 31.7 8 1.4   Oxygen Saturation Summary   Total Supine Non-Supine  Average SpO2 98.0% 97.2% 98.1%  Minimum SpO2 92.0% 93.0% 92.0%   Maximum SpO2 100.0% 99.0% 100.0%   Oxygen Saturation Distribution  Range (%) Time in range (min) Time in range (%)  90.0 - 100.0 373.9 100.0%  80.0 - 90.0 - -  70.0 - 80.0 - -  60.0 - 70.0 - -  50.0 -  60.0 - -  0.0 - 50.0 - -  Time Spent <=88% SpO2  Range (%) Time in range (min) Time in range (%)  0.0 - 88.0 - -  Cardiac Summary   Total Supine Non-Supine  Average Pulse Rate (BPM) 79.8 83.2 79.4  Minimum Pulse Rate (BPM) 57.0 69.0 57.0  Maximum Pulse Rate (BPM) 110.0 109.0 110.0                      Technologist Comments  -                         Jolee Naval, Biomedical engineer of Sleep Medicine  ELECTRONICALLY SIGNED ON:  08/31/2023, 1:46 PM [facility] PH: [phone]   FX: [phone] ACCREDITED BY THE AMERICAN ACADEMY OF SLEEP MEDICINE

## 2023-09-05 ENCOUNTER — Other Ambulatory Visit: Payer: Self-pay

## 2023-09-09 ENCOUNTER — Other Ambulatory Visit (HOSPITAL_COMMUNITY): Payer: Self-pay

## 2023-09-09 ENCOUNTER — Other Ambulatory Visit: Payer: Self-pay

## 2023-09-19 ENCOUNTER — Other Ambulatory Visit (HOSPITAL_COMMUNITY): Payer: Self-pay

## 2023-12-03 ENCOUNTER — Encounter: Payer: Self-pay | Admitting: Family Medicine

## 2023-12-03 ENCOUNTER — Other Ambulatory Visit (HOSPITAL_COMMUNITY): Payer: Self-pay

## 2023-12-03 ENCOUNTER — Other Ambulatory Visit: Payer: Self-pay | Admitting: Family Medicine

## 2023-12-03 MED ORDER — SEMAGLUTIDE (2 MG/DOSE) 8 MG/3ML ~~LOC~~ SOPN
2.0000 mg | PEN_INJECTOR | SUBCUTANEOUS | 1 refills | Status: AC
  Filled 2023-12-03: qty 3, 28d supply, fill #0
  Filled 2023-12-03: qty 9, fill #0
  Filled 2023-12-13: qty 3, 28d supply, fill #0
  Filled 2024-02-01 – 2024-03-02 (×3): qty 3, 28d supply, fill #1

## 2023-12-13 ENCOUNTER — Other Ambulatory Visit (HOSPITAL_COMMUNITY): Payer: Self-pay

## 2023-12-14 ENCOUNTER — Other Ambulatory Visit (HOSPITAL_COMMUNITY): Payer: Self-pay

## 2023-12-30 ENCOUNTER — Telehealth: Payer: Self-pay | Admitting: Family Medicine

## 2023-12-30 NOTE — Telephone Encounter (Signed)
Called patient to schedule an appointment. Patient stated he will call back to schedule an appointment.

## 2023-12-30 NOTE — Telephone Encounter (Signed)
 Copied from CRM #[id number]. Topic: Appointments - Scheduling Inquiry for Clinic >> Dec 30, 2023  9:42 AM Dedra B wrote:  Reason for CRM: Pt called to schedule an appt for a physical. He needs to have it before 9/1 or his rates will go up. There's no availability until 9/30 for a physical. Pls call pt.

## 2024-01-22 DIAGNOSIS — F330 Major depressive disorder, recurrent, mild: Secondary | ICD-10-CM | POA: Diagnosis not present

## 2024-02-10 DIAGNOSIS — F330 Major depressive disorder, recurrent, mild: Secondary | ICD-10-CM | POA: Diagnosis not present

## 2024-02-11 ENCOUNTER — Other Ambulatory Visit (HOSPITAL_COMMUNITY): Payer: Self-pay

## 2024-02-25 ENCOUNTER — Other Ambulatory Visit (HOSPITAL_COMMUNITY): Payer: Self-pay

## 2024-02-25 ENCOUNTER — Encounter (HOSPITAL_COMMUNITY): Payer: Self-pay | Admitting: Pharmacist

## 2024-02-27 NOTE — Unmapped External Note (Signed)
 [facility] special [facility] to engage in Wellness Coaching for [employer] prior to end of incentive year. LVMM and CB details.

## 2024-02-28 ENCOUNTER — Other Ambulatory Visit: Payer: Self-pay

## 2024-03-01 DIAGNOSIS — F330 Major depressive disorder, recurrent, mild: Secondary | ICD-10-CM | POA: Diagnosis not present

## 2024-03-02 ENCOUNTER — Other Ambulatory Visit: Payer: Self-pay

## 2024-03-02 ENCOUNTER — Other Ambulatory Visit (HOSPITAL_COMMUNITY): Payer: Self-pay

## 2024-03-04 ENCOUNTER — Other Ambulatory Visit (HOSPITAL_COMMUNITY): Payer: Self-pay

## 2024-03-04 ENCOUNTER — Emergency Department (HOSPITAL_BASED_OUTPATIENT_CLINIC_OR_DEPARTMENT_OTHER)
Admission: EM | Admit: 2024-03-04 | Discharge: 2024-03-04 | Disposition: A | Discharge status: Home / Self Care | Source: Ambulatory Visit | Attending: Emergency Medicine | Admitting: Emergency Medicine

## 2024-03-04 ENCOUNTER — Emergency Department (HOSPITAL_BASED_OUTPATIENT_CLINIC_OR_DEPARTMENT_OTHER)

## 2024-03-04 ENCOUNTER — Other Ambulatory Visit: Payer: Self-pay

## 2024-03-04 ENCOUNTER — Encounter (HOSPITAL_BASED_OUTPATIENT_CLINIC_OR_DEPARTMENT_OTHER): Payer: Self-pay | Admitting: Emergency Medicine

## 2024-03-04 DIAGNOSIS — N5082 Scrotal pain: Secondary | ICD-10-CM | POA: Diagnosis not present

## 2024-03-04 DIAGNOSIS — R1032 Left lower quadrant pain: Secondary | ICD-10-CM | POA: Insufficient documentation

## 2024-03-04 MED ORDER — NAPROXEN 500 MG PO TABS
500.0000 mg | ORAL_TABLET | Freq: Two times a day (BID) | ORAL | 0 refills | Status: DC
  Filled 2024-03-04: qty 14, 7d supply, fill #0

## 2024-03-04 MED ORDER — NAPROXEN 500 MG PO TABS: 500.0000 mg | ORAL_TABLET | Freq: Two times a day (BID) | ORAL | 0 refills | Status: AC

## 2024-03-04 MED ORDER — NAPROXEN 250 MG PO TABS
500.0000 mg | ORAL_TABLET | Freq: Once | ORAL | Status: AC
  Administered 2024-03-04: 500 mg via ORAL
  Filled 2024-03-04: qty 2

## 2024-03-04 NOTE — Discharge Instructions (Signed)
 Discussed the results of your ultrasound findings.  I prescribed a short course of anti-inflammatories to help with your symptoms, please take 1 tablet twice a day for the next 7 days with food.  If you experience any worsening symptoms please return to emergency department.

## 2024-03-04 NOTE — ED Notes (Signed)
 DC paperwork given and verbally understood.

## 2024-03-04 NOTE — ED Triage Notes (Signed)
 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

## 2024-03-04 NOTE — ED Provider Notes (Signed)
 [place] EMERGENCY DEPARTMENT AT [facility] Provider Note   CSN: [id number] Arrival date & time: 03/04/24  1316     Patient presents with: Groin Injury   Matthew Mcfarland is a 43 y.o. male.   43 y/o male with a PMH of a hydrocele presents to the ED with a chief complaint of left groin pain x yesterday. 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 He reports feeling a pulling sensation to the left groin, pain is exacerbated with moving along with bending. He noticed his testicle to be swollen yesterday but this has come down in size.He was evaluated by Health at work who referred him to the ED for ultrasound. He did take some ibuprofen  with improvement in symptoms. No nausea, vomiting, or abdominal pain.    The history is provided by the patient.       Prior to Admission medications   Medication Sig Start Date End Date Taking? Authorizing Provider  Lancets (FREESTYLE) lancets Use as instructed to check blood sugar 3 times daily before meals 03/27/23   Newlin, Enobong, MD  atorvastatin  (LIPITOR) 10 MG tablet Take 1 tablet (10 mg total) by mouth daily. In the evening to lower cholesterol 03/27/23   Delbert Clam, MD  Blood Glucose Monitoring Suppl (FREESTYLE LITE) w/Device KIT Use as directed to check blood sugar 3 (three) times daily. 03/27/23   Newlin, Enobong, MD  Blood Glucose Monitoring Suppl (CONTOUR NEXT MONITOR) w/Device KIT 1 strip by Does not apply route daily. 05/22/17   Durenda Alston SAUNDERS, FNP  cetirizine  (ZYRTEC ) 10 MG tablet Take 1 tablet (10 mg total) by mouth daily. 08/14/23   Newlin, Enobong, MD  ergocalciferol  (DRISDOL ) 1.25 MG (50000 UT) capsule Take 1 capsule (50,000 Units total) by mouth once a week. 03/28/23   Newlin, Enobong, MD  fluticasone  (FLONASE ) 50 MCG/ACT nasal spray Shake liquid and place 2 sprays into both nostrils daily. 12/22/22   Gladis, Mary-Margaret, FNP  glucose blood  test strip Use as instructed 05/21/17   Durenda Alston R, FNP  glucose blood test strip Use as instructed to check blood sugar 3 times daily 03/27/23   Newlin, Enobong, MD  metFORMIN  (GLUCOPHAGE -XR) 500 MG 24 hr tablet Take 2 tablets (1,000 mg total) by mouth daily with breakfast. 03/27/23   Newlin, Enobong, MD  naproxen  (NAPROSYN ) 500 MG tablet Take 1 tablet (500 mg total) by mouth 2 (two) times daily for 7 days. 03/04/24 03/11/24  Altonio Schwertner, PA-C  Semaglutide , 2 MG/DOSE, 8 MG/3ML SOPN Inject 2 mg as directed once a week. 12/03/23   Newlin, Enobong, MD    Allergies: Patient has no known allergies.    Review of Systems  Constitutional:  Negative for fever.  Respiratory:  Negative for shortness of breath.   Cardiovascular:  Negative for chest pain.  Gastrointestinal:  Negative for abdominal pain, nausea and vomiting.  Genitourinary:  Positive for scrotal swelling and testicular pain.  Musculoskeletal:  Negative for back pain.  All other systems reviewed and are negative.   Updated Vital Signs BP (!) 125/94 (BP Location: Right Arm)   Pulse 90   Temp 98 F (36.7 C) (Oral)   Resp 18   SpO2 98%   Physical Exam Vitals and nursing note reviewed. Exam conducted with a chaperone present.  Constitutional:      Appearance: Normal appearance.  HENT:     Head: Normocephalic and atraumatic.  Mouth/Throat:     Mouth: Mucous membranes are moist.  Cardiovascular:     Rate and Rhythm: Normal rate.  Pulmonary:     Effort: Pulmonary effort is normal.  Abdominal:     General: Abdomen is flat.  Genitourinary:    Comments: Chaperone  by [employer], patient without palpable hernia, mild ttp along the left groin with no swelling noted.  Musculoskeletal:     Cervical back: Normal range of motion and neck supple.  Skin:    General: Skin is warm and dry.  Neurological:     Mental Status: He is alert and oriented to person, place, and time.     (all labs ordered are listed, but only  abnormal results are displayed) Labs Reviewed - No data to display  EKG: None  Radiology: US  SCROTUM W/DOPPLER Result Date: 03/04/2024 CLINICAL DATA:  Scrotal pain EXAM: SCROTAL ULTRASOUND DOPPLER ULTRASOUND OF THE TESTICLES TECHNIQUE: Complete ultrasound examination of the testicles, epididymis, and other scrotal structures was performed. Color and spectral Doppler ultrasound were also utilized to evaluate blood flow to the testicles. COMPARISON:  02/20/2003 report FINDINGS: Right testicle Measurements: 4.1 x 2.3 x 2.5 cm. No mass or microlithiasis visualized. Left testicle Measurements: 4 x 2.3 x 2.6 cm. No mass or microlithiasis visualized. Right epididymis:  Normal in size and appearance. Left epididymis:  Normal in size and appearance. Hydrocele:  None visualized. Varicocele:  None visualized. Pulsed Doppler interrogation of both testes demonstrates normal low resistance arterial and venous waveforms bilaterally. IMPRESSION: Negative examination. Electronically Signed   By: Luke Bun M.D.   On: 03/04/2024 17:05     Procedures   Medications Ordered in the ED  naproxen  (NAPROSYN ) tablet 500 mg (has no administration in time range)                                    Medical Decision Making Amount and/or Complexity of Data Reviewed Radiology: ordered.  Risk Prescription drug management.    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  Went to help at work who recommended he to be evaluated via ultrasound in the emergency department.  He is complaining of left testicular swelling which occurred yesterday and now has improved after ibuprofen .  On evaluation there is no palpable hernia, there is mild tenderness to palpation along the left testicle.  No swelling noted.  No penile discharge, or rashes.  Concern for hernia versus torsion.  Although less likely due to mechanism we will obtain  ultrasound testicular for further evaluation. Given naproxen  for pain control.   ReSound scrotum without any acute findings at this time.  I do suspect likely strain.  No concerns for torsion, or any other acute abnormality.  He is hemodynamically stable, will go home on supportive treatment with NSAIDs.  Stable for discharge.   Portions of this note were generated with Scientist, clinical (histocompatibility and immunogenetics). Dictation errors may occur despite best attempts at proofreading.   Final diagnoses:  Left groin pain    ED Discharge Orders          Ordered    naproxen  (NAPROSYN ) 500 MG tablet  2 times daily,   Status:  Discontinued        03/04/24 1701    naproxen  (NAPROSYN ) 500 MG tablet  2 times daily  03/04/24 1722               Maureen Broad, PA-C 03/04/24 1722    Yolande Lamar BROCKS, MD 03/06/24 [phone]

## 2024-03-09 ENCOUNTER — Encounter: Payer: Self-pay | Admitting: Radiology

## 2024-03-14 DIAGNOSIS — F330 Major depressive disorder, recurrent, mild: Secondary | ICD-10-CM | POA: Diagnosis not present

## 2024-03-30 ENCOUNTER — Encounter

## 2024-04-18 ENCOUNTER — Encounter: Admitting: Nurse Practitioner

## 2024-04-18 DIAGNOSIS — R21 Rash and other nonspecific skin eruption: Secondary | ICD-10-CM

## 2024-04-18 NOTE — Progress Notes (Signed)
° °  Thank you for the details you included in the comment boxes. Those details are very helpful in determining the best course of treatment for you and help us  to provide the best care.Because I can not tell what body part you have sent a picture of and It does not appear to be eczema, we recommend that you schedule a Virtual Urgent Care video visit in order for the provider to better assess what is going on.  The provider will be able to give you a more accurate diagnosis and treatment plan if we can more freely discuss your symptoms and with the addition of a virtual examination.   If you change your visit to a video visit, we will bill your insurance (similar to an office visit) and you will not be charged for this e-Visit. You will be able to stay at home and speak with the first available [facility] advanced practice provider. The link to do a video visit is in the drop down Menu tab of your Welcome screen in MyChart.

## 2024-04-19 ENCOUNTER — Telehealth
